# Patient Record
Sex: Female | Born: 1970 | Race: White | Hispanic: No | Marital: Married | Smoking: Never smoker
Health system: Southern US, Community
[De-identification: ages and names within clinical notes are randomized; demographics above are authoritative.]

## PROBLEM LIST (undated history)

## (undated) DIAGNOSIS — G8929 Other chronic pain: Secondary | ICD-10-CM

## (undated) DIAGNOSIS — J45909 Unspecified asthma, uncomplicated: Secondary | ICD-10-CM

## (undated) DIAGNOSIS — F909 Attention-deficit hyperactivity disorder, unspecified type: Secondary | ICD-10-CM

## (undated) DIAGNOSIS — M549 Dorsalgia, unspecified: Secondary | ICD-10-CM

## (undated) DIAGNOSIS — K317 Polyp of stomach and duodenum: Secondary | ICD-10-CM

## (undated) DIAGNOSIS — M1990 Unspecified osteoarthritis, unspecified site: Secondary | ICD-10-CM

## (undated) HISTORY — PX: KNEE SURGERY: SHX244

## (undated) HISTORY — DX: Dorsalgia, unspecified: M54.9

## (undated) HISTORY — PX: APPENDECTOMY: SHX54

## (undated) HISTORY — DX: Other chronic pain: G89.29

## (undated) HISTORY — PX: CHOLECYSTECTOMY: SHX55

## (undated) HISTORY — DX: Unspecified osteoarthritis, unspecified site: M19.90

## (undated) HISTORY — DX: Polyp of stomach and duodenum: K31.7

## (undated) HISTORY — PX: OVARIAN CYST REMOVAL: SHX89

---

## 1997-11-19 ENCOUNTER — Emergency Department (HOSPITAL_COMMUNITY): Admission: EM | Admit: 1997-11-19 | Discharge: 1997-11-19 | Payer: Self-pay | Admitting: Family Medicine

## 2005-01-27 ENCOUNTER — Inpatient Hospital Stay: Payer: Self-pay | Admitting: Internal Medicine

## 2005-01-27 ENCOUNTER — Ambulatory Visit: Payer: Self-pay | Admitting: Internal Medicine

## 2005-01-30 ENCOUNTER — Other Ambulatory Visit: Payer: Self-pay

## 2005-02-20 ENCOUNTER — Ambulatory Visit: Payer: Self-pay | Admitting: Internal Medicine

## 2005-05-01 ENCOUNTER — Ambulatory Visit: Payer: Self-pay | Admitting: Internal Medicine

## 2005-08-11 ENCOUNTER — Ambulatory Visit: Payer: Self-pay | Admitting: Surgery

## 2006-09-23 ENCOUNTER — Ambulatory Visit: Payer: Self-pay | Admitting: Internal Medicine

## 2006-10-14 ENCOUNTER — Ambulatory Visit: Payer: Self-pay | Admitting: Internal Medicine

## 2007-05-06 ENCOUNTER — Ambulatory Visit: Payer: Self-pay | Admitting: Gastroenterology

## 2007-06-02 ENCOUNTER — Ambulatory Visit: Payer: Self-pay | Admitting: Emergency Medicine

## 2007-06-03 ENCOUNTER — Ambulatory Visit: Payer: Self-pay | Admitting: Emergency Medicine

## 2010-12-10 ENCOUNTER — Other Ambulatory Visit: Payer: Self-pay | Admitting: Occupational Medicine

## 2010-12-10 ENCOUNTER — Ambulatory Visit: Payer: Self-pay

## 2010-12-10 DIAGNOSIS — R52 Pain, unspecified: Secondary | ICD-10-CM

## 2011-01-06 ENCOUNTER — Ambulatory Visit
Admission: RE | Admit: 2011-01-06 | Discharge: 2011-01-06 | Disposition: A | Discharge status: Home / Self Care | Payer: PRIVATE HEALTH INSURANCE | Source: Ambulatory Visit | Attending: Family Medicine | Admitting: Family Medicine

## 2011-01-06 ENCOUNTER — Other Ambulatory Visit: Payer: Self-pay | Admitting: Family Medicine

## 2011-01-06 DIAGNOSIS — M545 Low back pain: Secondary | ICD-10-CM

## 2011-01-08 ENCOUNTER — Ambulatory Visit: Payer: PRIVATE HEALTH INSURANCE

## 2011-01-13 ENCOUNTER — Ambulatory Visit: Payer: PRIVATE HEALTH INSURANCE | Attending: Family Medicine

## 2011-01-13 DIAGNOSIS — IMO0001 Reserved for inherently not codable concepts without codable children: Secondary | ICD-10-CM | POA: Insufficient documentation

## 2011-01-13 DIAGNOSIS — M25519 Pain in unspecified shoulder: Secondary | ICD-10-CM | POA: Insufficient documentation

## 2011-01-13 DIAGNOSIS — M545 Low back pain, unspecified: Secondary | ICD-10-CM | POA: Insufficient documentation

## 2011-01-13 DIAGNOSIS — R5381 Other malaise: Secondary | ICD-10-CM | POA: Insufficient documentation

## 2011-01-20 ENCOUNTER — Ambulatory Visit: Payer: PRIVATE HEALTH INSURANCE

## 2011-01-22 ENCOUNTER — Ambulatory Visit: Payer: PRIVATE HEALTH INSURANCE

## 2011-01-28 ENCOUNTER — Ambulatory Visit: Payer: PRIVATE HEALTH INSURANCE | Attending: Family Medicine

## 2011-01-28 DIAGNOSIS — M25519 Pain in unspecified shoulder: Secondary | ICD-10-CM | POA: Insufficient documentation

## 2011-01-28 DIAGNOSIS — R5381 Other malaise: Secondary | ICD-10-CM | POA: Insufficient documentation

## 2011-01-28 DIAGNOSIS — M545 Low back pain, unspecified: Secondary | ICD-10-CM | POA: Insufficient documentation

## 2011-01-28 DIAGNOSIS — IMO0001 Reserved for inherently not codable concepts without codable children: Secondary | ICD-10-CM | POA: Insufficient documentation

## 2011-01-29 ENCOUNTER — Ambulatory Visit: Payer: PRIVATE HEALTH INSURANCE

## 2011-02-03 ENCOUNTER — Ambulatory Visit: Payer: PRIVATE HEALTH INSURANCE

## 2011-02-05 ENCOUNTER — Other Ambulatory Visit (HOSPITAL_COMMUNITY): Payer: Self-pay | Admitting: Family Medicine

## 2011-02-05 DIAGNOSIS — M25511 Pain in right shoulder: Secondary | ICD-10-CM

## 2011-02-10 ENCOUNTER — Ambulatory Visit: Payer: PRIVATE HEALTH INSURANCE

## 2011-02-12 ENCOUNTER — Other Ambulatory Visit (HOSPITAL_COMMUNITY): Payer: Self-pay

## 2011-02-12 ENCOUNTER — Encounter: Payer: Self-pay | Admitting: Physical Therapy

## 2011-02-12 ENCOUNTER — Ambulatory Visit (HOSPITAL_COMMUNITY)
Admission: RE | Admit: 2011-02-12 | Discharge: 2011-02-12 | Disposition: A | Discharge status: Home / Self Care | Payer: PRIVATE HEALTH INSURANCE | Source: Ambulatory Visit | Attending: Family Medicine | Admitting: Family Medicine

## 2011-02-12 DIAGNOSIS — M25511 Pain in right shoulder: Secondary | ICD-10-CM

## 2011-02-12 DIAGNOSIS — IMO0001 Reserved for inherently not codable concepts without codable children: Secondary | ICD-10-CM | POA: Insufficient documentation

## 2011-02-12 DIAGNOSIS — X58XXXA Exposure to other specified factors, initial encounter: Secondary | ICD-10-CM | POA: Insufficient documentation

## 2011-02-12 DIAGNOSIS — M25519 Pain in unspecified shoulder: Secondary | ICD-10-CM | POA: Insufficient documentation

## 2011-02-25 ENCOUNTER — Ambulatory Visit: Payer: PRIVATE HEALTH INSURANCE | Admitting: Physical Therapy

## 2011-03-02 ENCOUNTER — Ambulatory Visit: Payer: PRIVATE HEALTH INSURANCE | Attending: Family Medicine | Admitting: Physical Therapy

## 2011-03-02 DIAGNOSIS — IMO0001 Reserved for inherently not codable concepts without codable children: Secondary | ICD-10-CM | POA: Insufficient documentation

## 2011-03-02 DIAGNOSIS — R5381 Other malaise: Secondary | ICD-10-CM | POA: Insufficient documentation

## 2011-03-02 DIAGNOSIS — M545 Low back pain, unspecified: Secondary | ICD-10-CM | POA: Insufficient documentation

## 2011-03-02 DIAGNOSIS — M25519 Pain in unspecified shoulder: Secondary | ICD-10-CM | POA: Insufficient documentation

## 2011-03-04 ENCOUNTER — Encounter: Payer: Self-pay | Admitting: Physical Therapy

## 2011-03-06 ENCOUNTER — Ambulatory Visit: Payer: PRIVATE HEALTH INSURANCE

## 2011-03-09 ENCOUNTER — Ambulatory Visit: Payer: PRIVATE HEALTH INSURANCE | Admitting: Physical Therapy

## 2011-03-16 ENCOUNTER — Ambulatory Visit: Payer: PRIVATE HEALTH INSURANCE | Admitting: Physical Therapy

## 2011-03-18 ENCOUNTER — Ambulatory Visit: Payer: PRIVATE HEALTH INSURANCE | Attending: Family Medicine | Admitting: Physical Therapy

## 2011-03-18 DIAGNOSIS — M25519 Pain in unspecified shoulder: Secondary | ICD-10-CM | POA: Insufficient documentation

## 2011-03-18 DIAGNOSIS — M545 Low back pain, unspecified: Secondary | ICD-10-CM | POA: Insufficient documentation

## 2011-03-18 DIAGNOSIS — R5381 Other malaise: Secondary | ICD-10-CM | POA: Insufficient documentation

## 2011-03-18 DIAGNOSIS — IMO0001 Reserved for inherently not codable concepts without codable children: Secondary | ICD-10-CM | POA: Insufficient documentation

## 2011-03-19 ENCOUNTER — Other Ambulatory Visit: Payer: Self-pay | Admitting: Specialist

## 2011-03-19 DIAGNOSIS — M549 Dorsalgia, unspecified: Secondary | ICD-10-CM

## 2011-03-23 ENCOUNTER — Encounter: Payer: Self-pay | Admitting: Physical Therapy

## 2011-03-24 ENCOUNTER — Ambulatory Visit
Admission: RE | Admit: 2011-03-24 | Discharge: 2011-03-24 | Disposition: A | Discharge status: Home / Self Care | Payer: PRIVATE HEALTH INSURANCE | Source: Ambulatory Visit | Attending: Specialist | Admitting: Specialist

## 2011-03-24 DIAGNOSIS — M549 Dorsalgia, unspecified: Secondary | ICD-10-CM

## 2011-03-25 ENCOUNTER — Encounter: Payer: Self-pay | Admitting: Physical Therapy

## 2012-02-04 LAB — HM PAP SMEAR: HM PAP: NEGATIVE

## 2012-02-18 ENCOUNTER — Other Ambulatory Visit: Payer: Self-pay | Admitting: Internal Medicine

## 2012-02-23 LAB — CULTURE, BLOOD (SINGLE)

## 2012-03-26 ENCOUNTER — Ambulatory Visit: Payer: Self-pay | Admitting: Internal Medicine

## 2012-06-01 ENCOUNTER — Emergency Department (HOSPITAL_COMMUNITY)
Admission: EM | Admit: 2012-06-01 | Discharge: 2012-06-01 | Disposition: A | Discharge status: Home / Self Care | Payer: PRIVATE HEALTH INSURANCE | Attending: Emergency Medicine | Admitting: Emergency Medicine

## 2012-06-01 ENCOUNTER — Encounter (HOSPITAL_COMMUNITY): Payer: Self-pay | Admitting: Emergency Medicine

## 2012-06-01 DIAGNOSIS — F909 Attention-deficit hyperactivity disorder, unspecified type: Secondary | ICD-10-CM | POA: Insufficient documentation

## 2012-06-01 DIAGNOSIS — M549 Dorsalgia, unspecified: Secondary | ICD-10-CM | POA: Insufficient documentation

## 2012-06-01 DIAGNOSIS — J45909 Unspecified asthma, uncomplicated: Secondary | ICD-10-CM | POA: Insufficient documentation

## 2012-06-01 DIAGNOSIS — Z79899 Other long term (current) drug therapy: Secondary | ICD-10-CM | POA: Insufficient documentation

## 2012-06-01 HISTORY — DX: Attention-deficit hyperactivity disorder, unspecified type: F90.9

## 2012-06-01 HISTORY — DX: Dorsalgia, unspecified: M54.9

## 2012-06-01 HISTORY — DX: Unspecified asthma, uncomplicated: J45.909

## 2012-06-01 MED ORDER — DIPHENHYDRAMINE HCL 25 MG PO CAPS
25.0000 mg | ORAL_CAPSULE | Freq: Once | ORAL | Status: AC
  Administered 2012-06-01: 25 mg via ORAL
  Filled 2012-06-01: qty 1

## 2012-06-01 MED ORDER — HYDROMORPHONE HCL PF 1 MG/ML IJ SOLN
1.0000 mg | Freq: Once | INTRAMUSCULAR | Status: AC
  Administered 2012-06-01: 1 mg via INTRAMUSCULAR
  Filled 2012-06-01: qty 1

## 2012-06-01 MED ORDER — KETOROLAC TROMETHAMINE 60 MG/2ML IM SOLN
60.0000 mg | Freq: Once | INTRAMUSCULAR | Status: AC
  Administered 2012-06-01: 60 mg via INTRAMUSCULAR
  Filled 2012-06-01: qty 2

## 2012-06-01 NOTE — ED Notes (Signed)
Pt is here for ongoing back pain. Hx of back pain.

## 2012-06-01 NOTE — ED Provider Notes (Signed)
History     CSN: [id number]  Arrival date & time 06/01/12  1757   First MD Initiated Contact with Patient 06/01/12 1819      Chief Complaint  Patient presents with  . Back Pain    (Consider location/radiation/quality/duration/timing/severity/associated sxs/prior treatment) Patient is a 42 y.o. female presenting with back pain. The history is provided by the patient.  Back Pain Location:  Lumbar spine Quality:  Aching Radiates to:  R thigh and L thigh Pain severity:  Severe Pain is:  Same all the time Onset quality:  Gradual Timing:  Constant Progression:  Worsening Chronicity:  Recurrent Relieved by:  Nothing Worsened by:  Standing Ineffective treatments:  Muscle relaxants Associated symptoms: no bladder incontinence, no dysuria, no fever and no perianal numbness     Past Medical History  Diagnosis Date  . Back pain   . Asthma   . ADHD (attention deficit hyperactivity disorder)     Past Surgical History  Procedure Laterality Date  . Knee surgery    . Appendectomy    . Cholecystectomy    . Ovarian cyst removal      No family history on file.  History  Substance Use Topics  . Smoking status: Not on file  . Smokeless tobacco: Not on file  . Alcohol Use: Not on file    OB History   Grav Para Term Preterm Abortions TAB SAB Ect Mult Living                  Review of Systems  Constitutional: Negative for fever.  Genitourinary: Negative for bladder incontinence and dysuria.  Musculoskeletal: Positive for back pain.  All other systems reviewed and are negative.    Allergies  Stadol; Demerol; Imitrex; Influenza vaccines; Latex; Lidocaine; Morphine and related; Phenergan; Procardia; Terazol; and Tetanus toxoids  Home Medications   Current Outpatient Rx  Name  Route  Sig  Dispense  Refill  . amphetamine-dextroamphetamine (ADDERALL) 30 MG tablet   Oral   Take 30 mg by mouth 2 (two) times daily.         Marland Kitchen azelastine (ASTELIN) 137 MCG/SPRAY nasal  spray   Nasal   Place 1 spray into the nose 2 (two) times daily. Use in each nostril as directed         . cetirizine (ZYRTEC) 10 MG tablet   Oral   Take 10 mg by mouth daily.         . diazepam (VALIUM) 5 MG tablet   Oral   Take 5 mg by mouth 3 (three) times daily.         Marland Kitchen dicyclomine (BENTYL) 20 MG tablet   Oral   Take 20 mg by mouth every 6 (six) hours. Irritable bowel         . levalbuterol (XOPENEX HFA) 45 MCG/ACT inhaler   Inhalation   Inhale 1-2 puffs into the lungs every 4 (four) hours as needed for wheezing.         . ondansetron (ZOFRAN) 4 MG tablet   Oral   Take 4 mg by mouth every 8 (eight) hours as needed for nausea.         . sucralfate (CARAFATE) 1 G tablet   Oral   Take 1 g by mouth 3 (three) times daily.         . traMADol (ULTRAM) 50 MG tablet   Oral   Take 50-100 mg by mouth every 8 (eight) hours as needed for pain.  BP 147/91  Pulse 129  Temp(Src) 98.6 F (37 C) (Oral)  Resp 17  SpO2 99%  Physical Exam  Nursing note and vitals reviewed. Constitutional: She is oriented to person, place, and time. She appears well-developed and well-nourished.  Non-toxic appearance. No distress.  HENT:  Head: Normocephalic and atraumatic.  Eyes: Conjunctivae, EOM and lids are normal. Pupils are equal, round, and reactive to light.  Neck: Normal range of motion. Neck supple. No tracheal deviation present. No mass present.  Cardiovascular: Regular rhythm and normal heart sounds.  Tachycardia present.  Exam reveals no gallop.   No murmur heard. Pulmonary/Chest: Effort normal and breath sounds normal. No stridor. No respiratory distress. She has no decreased breath sounds. She has no wheezes. She has no rhonchi. She has no rales.  Abdominal: Soft. Normal appearance and bowel sounds are normal. She exhibits no distension. There is no tenderness. There is no rebound and no CVA tenderness.  Musculoskeletal: Normal range of motion. She exhibits  no edema and no tenderness.       Back:  Neurological: She is alert and oriented to person, place, and time. She has normal strength. No cranial nerve deficit or sensory deficit. GCS eye subscore is 4. GCS verbal subscore is 5. GCS motor subscore is 6.  Skin: Skin is warm and dry. No abrasion and no rash noted.  Psychiatric: She has a normal mood and affect. Her speech is normal and behavior is normal.    ED Course  Procedures (including critical care time)  Labs Reviewed - No data to display No results found.   No diagnosis found.    MDM  Patient given Toradol and opiates. Will followup with her Dr. Sharen Hones week       Toy Baker, MD 06/01/12 1950

## 2013-06-25 ENCOUNTER — Emergency Department: Payer: Self-pay | Admitting: Emergency Medicine

## 2014-09-20 ENCOUNTER — Ambulatory Visit: Payer: Medicaid Other | Admitting: Obstetrics and Gynecology

## 2014-10-09 ENCOUNTER — Encounter: Payer: Self-pay | Admitting: *Deleted

## 2014-10-11 ENCOUNTER — Encounter: Payer: Medicaid Other | Admitting: Obstetrics and Gynecology

## 2014-11-21 ENCOUNTER — Encounter: Payer: Medicaid Other | Admitting: Obstetrics and Gynecology

## 2014-12-26 ENCOUNTER — Other Ambulatory Visit: Payer: Self-pay | Admitting: Internal Medicine

## 2014-12-26 DIAGNOSIS — M5136 Other intervertebral disc degeneration, lumbar region: Secondary | ICD-10-CM

## 2015-01-03 ENCOUNTER — Encounter: Payer: Medicaid Other | Admitting: Obstetrics and Gynecology

## 2015-01-09 ENCOUNTER — Ambulatory Visit: Payer: PRIVATE HEALTH INSURANCE

## 2015-01-25 ENCOUNTER — Ambulatory Visit: Payer: Medicaid Other

## 2015-02-14 ENCOUNTER — Ambulatory Visit: Payer: Medicaid Other

## 2015-02-21 ENCOUNTER — Encounter: Payer: Self-pay | Admitting: Obstetrics and Gynecology

## 2015-02-21 ENCOUNTER — Ambulatory Visit (INDEPENDENT_AMBULATORY_CARE_PROVIDER_SITE_OTHER): Payer: Medicaid Other | Admitting: Obstetrics and Gynecology

## 2015-02-21 ENCOUNTER — Telehealth: Payer: Self-pay | Admitting: Obstetrics and Gynecology

## 2015-02-21 VITALS — BP 102/68 | HR 110 | Ht 65.0 in | Wt 136.0 lb

## 2015-02-21 DIAGNOSIS — Z8639 Personal history of other endocrine, nutritional and metabolic disease: Secondary | ICD-10-CM | POA: Diagnosis not present

## 2015-02-21 DIAGNOSIS — T402X5A Adverse effect of other opioids, initial encounter: Secondary | ICD-10-CM | POA: Diagnosis not present

## 2015-02-21 DIAGNOSIS — K5903 Drug induced constipation: Secondary | ICD-10-CM

## 2015-02-21 DIAGNOSIS — M5136 Other intervertebral disc degeneration, lumbar region: Secondary | ICD-10-CM | POA: Insufficient documentation

## 2015-02-21 DIAGNOSIS — Z0000 Encounter for general adult medical examination without abnormal findings: Secondary | ICD-10-CM | POA: Diagnosis not present

## 2015-02-21 DIAGNOSIS — Z01419 Encounter for gynecological examination (general) (routine) without abnormal findings: Secondary | ICD-10-CM

## 2015-02-21 MED ORDER — NORETHIN-ETH ESTRAD-FE BIPHAS 1 MG-10 MCG / 10 MCG PO TABS: 1.0000 | ORAL_TABLET | Freq: Every day | ORAL | Status: DC

## 2015-02-21 MED ORDER — LINACLOTIDE 290 MCG PO CAPS: 290.0000 ug | ORAL_CAPSULE | Freq: Every day | ORAL | Status: DC

## 2015-02-21 NOTE — Patient Instructions (Signed)
  Place annual gynecologic exam patient instructions here.  Thank you for enrolling in MyChart. Please follow the instructions below to securely access your online medical record. MyChart allows you to send messages to your doctor, view your test results, manage appointments, and more.   How Do I Sign Up? 1. In your Internet browser, go to [employer] Address [employer] and enter [email or web address]. 2. Click on the Sign Up Now link in the Sign In box. You will see the New Member Sign Up page. 3. Enter your MyChart Access Code exactly as it appears below. You will not need to use this code after you've completed the sign-up process. If you do not sign up before the expiration date, you must request a new code.  MyChart Access Code: [email or web address] Expires: 03/24/2015 10:34 AM  4. Enter your Social Security Number ([id number]) and Date of Birth (mm/dd/yyyy) as indicated and click Submit. You will be taken to the next sign-up page. 5. Create a MyChart ID. This will be your MyChart login ID and cannot be changed, so think of one that is secure and easy to remember. 6. Create a MyChart password. You can change your password at any time. 7. Enter your Password Reset Question and Answer. This can be used at a later time if you forget your password.  8. Enter your e-mail address. You will receive e-mail notification when new information is available in MyChart. 9. Click Sign Up. You can now view your medical record.   Additional Information Remember, MyChart is NOT to be used for urgent needs. For medical emergencies, dial 911.

## 2015-02-21 NOTE — Progress Notes (Signed)
Subjective:   Emily Lynn is a 44 y.o. No obstetric history on file. Caucasian female here for a routine well-woman exam.  No LMP recorded. Patient is not currently having periods (Reason: Oral contraceptives).    Current complaints: back pain and spasms PCP: Klien      Doesn't need  Labs- PCP does  Social History: Sexual: heterosexual Marital Status: married Living situation: with family Occupation: unemployed Tobacco/alcohol: no tobacco use Illicit drugs: no history of illicit drug use  The following portions of the patient's history were reviewed and updated as appropriate: allergies, current medications, past family history, past medical history, past social history, past surgical history and problem list.  Past Medical History Past Medical History  Diagnosis Date  . Back pain   . Asthma   . ADHD (attention deficit hyperactivity disorder)   . Gastric polyp   . Arthritis   . Chronic back pain   . ADHD (attention deficit hyperactivity disorder)     Past Surgical History Past Surgical History  Procedure Laterality Date  . Knee surgery    . Appendectomy    . Cholecystectomy    . Ovarian cyst removal      Gynecologic History No obstetric history on file.  No LMP recorded. Patient is not currently having periods (Reason: Oral contraceptives). Contraception: coitus interruptus Last Pap: 2013. Results were: normal Last mammogram: at age 44. Results were: normal  Obstetric History OB History  No data available    Current Medications Current Outpatient Prescriptions on File Prior to Visit  Medication Sig Dispense Refill  . amphetamine-dextroamphetamine (ADDERALL) 30 MG tablet Take 30 mg by mouth 2 (two) times daily.    Marland Kitchen. azelastine (ASTELIN) 137 MCG/SPRAY nasal spray Place 1 spray into the nose 2 (two) times daily. Use in each nostril as directed    . cetirizine (ZYRTEC) 10 MG tablet Take 10 mg by mouth daily.    . diazepam (VALIUM) 5 MG tablet Take 5 mg by mouth  2 (two) times daily.     Marland Kitchen. dicyclomine (BENTYL) 20 MG tablet Take 20 mg by mouth every 6 (six) hours. Irritable bowel    . levalbuterol (XOPENEX HFA) 45 MCG/ACT inhaler Inhale 1-2 puffs into the lungs every 4 (four) hours as needed for wheezing.    . ondansetron (ZOFRAN) 4 MG tablet Take 4 mg by mouth every 8 (eight) hours as needed for nausea.    . sucralfate (CARAFATE) 1 G tablet Take 1 g by mouth 3 (three) times daily.    . traMADol (ULTRAM) 50 MG tablet Take 50-100 mg by mouth every 8 (eight) hours as needed for pain.     No current facility-administered medications on file prior to visit.    Review of Systems Patient denies any headaches, blurred vision, shortness of breath, chest pain, abdominal pain, problems with bowel movements, urination, or intercourse.  Objective:  BP 102/68 mmHg  Pulse 110  Ht 5\' 5"  (1.651 m)  Wt 136 lb (61.689 kg)  BMI 22.63 kg/m2 Physical Exam  General:  Well developed, well nourished, no acute distress. She is alert and oriented x3. Skin:  Warm and dry Neck:  Midline trachea, no thyromegaly or nodules Cardiovascular: Increased but Regular rate and rhythm, no murmur heard Lungs:  Effort normal, all lung fields clear to auscultation bilaterally Breasts:  No dominant palpable mass, retraction, or nipple discharge, fibrocystic bilaterally Abdomen:  Soft, non tender, no hepatosplenomegaly or masses Pelvic:  External genitalia is normal in appearance.  The vagina  is normal in appearance. The cervix is bulbous, no CMT.  Thin prep pap is done with HR HPV cotesting. Uterus is felt to be normal size, shape, and contour.  No adnexal masses or tenderness noted. Extremities:  No swelling or varicosities noted Psych:  She has a normal mood and affect, with flight of thought, easily aggitated  Assessment:   Healthy well-woman exam OCP user Constipation H/o vit D deficiency Chronic back pain  Plan:  Pap and labs obtianed F/U 1 year for AE, or sooner if  needed Mammogram scheduled Linzess and LoLoEstrin prescribed Emilliano Dilworth Suzan Nailer, CNM

## 2015-02-21 NOTE — Telephone Encounter (Signed)
PT CALLED AND SHE WANTED ME TO LET YOU KNOW THAT HER FERTERNAL UNCLE HAD PROSTATE CANCER AND ANOTHER BROTHER OTHER THEN THE ONE THAT WAS WRITTEN DOWN HAD MELANOMA.

## 2015-02-22 ENCOUNTER — Other Ambulatory Visit: Payer: Self-pay | Admitting: Obstetrics and Gynecology

## 2015-02-22 DIAGNOSIS — E559 Vitamin D deficiency, unspecified: Secondary | ICD-10-CM

## 2015-02-22 LAB — VITAMIN D 25 HYDROXY (VIT D DEFICIENCY, FRACTURES): Vit D, 25-Hydroxy: 17.5 ng/mL — ABNORMAL LOW (ref 30.0–100.0)

## 2015-02-22 MED ORDER — VITAMIN D (ERGOCALCIFEROL) 1.25 MG (50000 UNIT) PO CAPS: 50000.0000 [IU] | ORAL_CAPSULE | ORAL | Status: DC

## 2015-02-26 LAB — CYTOLOGY - PAP

## 2015-03-11 ENCOUNTER — Ambulatory Visit
Admission: RE | Admit: 2015-03-11 | Discharge: 2015-03-11 | Disposition: A | Discharge status: Home / Self Care | Payer: Medicaid Other | Source: Ambulatory Visit | Attending: Internal Medicine | Admitting: Internal Medicine

## 2015-03-11 DIAGNOSIS — M5136 Other intervertebral disc degeneration, lumbar region: Secondary | ICD-10-CM

## 2015-03-11 DIAGNOSIS — M479 Spondylosis, unspecified: Secondary | ICD-10-CM | POA: Diagnosis not present

## 2015-03-11 NOTE — Telephone Encounter (Signed)
Mailed info to pt about her Vit d

## 2015-03-11 NOTE — Telephone Encounter (Signed)
-----   Message from KincheloeMelody N Shambley, [place] sent at 02/22/2015  8:29 AM EST ----- Please let her know vit D is still low, needs to be between 30-100, ideally above 50. I sent in a rx for 2 x weekly supplement, and she needs to have it rechecked in 3 months, when it gets above 30 she will need to remain on weekly dosing.  Please mail info on vit D def.

## 2015-05-02 ENCOUNTER — Telehealth: Payer: Self-pay | Admitting: Obstetrics and Gynecology

## 2015-05-02 NOTE — Telephone Encounter (Signed)
She wants hydrocoritisone suppositories and Linzess lower dose.  [facility] .

## 2015-05-02 NOTE — Telephone Encounter (Signed)
pls advise

## 2015-05-02 NOTE — Telephone Encounter (Signed)
Ok to refill both and send there, x 3 months refills

## 2015-05-06 ENCOUNTER — Other Ambulatory Visit: Payer: Self-pay | Admitting: *Deleted

## 2015-05-06 MED ORDER — LINACLOTIDE 145 MCG PO CAPS: 145.0000 ug | ORAL_CAPSULE | Freq: Every day | ORAL | Status: DC

## 2015-05-30 ENCOUNTER — Ambulatory Visit: Payer: Medicaid Other | Admitting: Obstetrics and Gynecology

## 2015-06-20 ENCOUNTER — Ambulatory Visit: Payer: Medicaid Other | Admitting: Obstetrics and Gynecology

## 2015-07-11 ENCOUNTER — Telehealth: Payer: Self-pay | Admitting: Obstetrics and Gynecology

## 2015-07-11 NOTE — Telephone Encounter (Signed)
ptcalled and said she is having break thru bleeding and doesn't want to take LOestren FE anymore and wants a Monophasic ???  BC to take it's place/  SHE CANCELLED HER LAB TOMORROW AND SAID SHE WOULD DO IT SOON/ REMIND YOU THAT HER POT WAS LOW LAST TIME 3.2

## 2015-07-11 NOTE — Telephone Encounter (Signed)
pls advise

## 2015-07-11 NOTE — Telephone Encounter (Signed)
Please ask her what pill she prefers- as Lo estrin is 20mg , and next dose up is 30mg  and a few different options. Also she needs to go ahead and get labs drawn.

## 2015-07-12 ENCOUNTER — Other Ambulatory Visit: Payer: Self-pay | Admitting: *Deleted

## 2015-07-12 ENCOUNTER — Ambulatory Visit: Payer: Medicaid Other | Admitting: Obstetrics and Gynecology

## 2015-07-12 ENCOUNTER — Other Ambulatory Visit: Payer: Medicaid Other

## 2015-07-12 MED ORDER — DROSPIRENONE-ETHINYL ESTRADIOL 3-0.02 MG PO TABS: 1.0000 | ORAL_TABLET | Freq: Every day | ORAL | Status: DC

## 2015-07-12 NOTE — Telephone Encounter (Signed)
Sent in yaz for pt

## 2015-12-25 ENCOUNTER — Ambulatory Visit (INDEPENDENT_AMBULATORY_CARE_PROVIDER_SITE_OTHER): Payer: Medicaid Other | Admitting: Obstetrics and Gynecology

## 2015-12-25 ENCOUNTER — Encounter: Payer: Self-pay | Admitting: Obstetrics and Gynecology

## 2015-12-25 VITALS — BP 122/80 | HR 100 | Ht 65.0 in | Wt 134.2 lb

## 2015-12-25 DIAGNOSIS — N921 Excessive and frequent menstruation with irregular cycle: Secondary | ICD-10-CM | POA: Diagnosis not present

## 2015-12-25 DIAGNOSIS — E559 Vitamin D deficiency, unspecified: Secondary | ICD-10-CM | POA: Diagnosis not present

## 2015-12-25 DIAGNOSIS — G8929 Other chronic pain: Secondary | ICD-10-CM

## 2015-12-25 DIAGNOSIS — N938 Other specified abnormal uterine and vaginal bleeding: Secondary | ICD-10-CM

## 2015-12-25 DIAGNOSIS — N946 Dysmenorrhea, unspecified: Secondary | ICD-10-CM

## 2015-12-25 DIAGNOSIS — R000 Tachycardia, unspecified: Secondary | ICD-10-CM | POA: Diagnosis not present

## 2015-12-25 MED ORDER — TRAMADOL HCL 50 MG PO TABS: 100.0000 mg | ORAL_TABLET | Freq: Four times a day (QID) | ORAL | 0 refills | Status: AC | PRN

## 2015-12-25 MED ORDER — YASMIN 28 3-0.03 MG PO TABS: 1.0000 | ORAL_TABLET | Freq: Every day | ORAL | 4 refills | Status: DC

## 2015-12-25 MED ORDER — DICLOFENAC SODIUM 1 % TD GEL: 2.0000 g | Freq: Four times a day (QID) | TRANSDERMAL | 2 refills | Status: AC

## 2015-12-25 NOTE — Progress Notes (Signed)
``  Subjective:     Patient ID: Emily Lynn, female   DOB: January 02, 1971, 45 y.o.   MRN: [id number]  HPI Reports breakthrough bleeding for last 2 months with heavy menses and clots, still taking same OCP (generic Beyaz). States cramping severe with bleeding. Feels like it is related to reduction in pain medications by PCP as timing is related, although she has done this before on other OCPs.  Reports increased need for tramadol and voltaren gel with bleeding and cramping and PCP refuses to give it to her.  Also concerned about elevated HR with activity and pain med use. This is not new but seems more of an issue.   Review of Systems Negative except stated  in HPI    Objective:   Physical Exam A&O x4 Well groomed female in no distress Blood pressure 122/80, pulse 100, height 5\' 5"  (1.651 m), weight 134 lb 3.2 oz (60.9 kg). Abdomen soft and non-tender Pelvic exam: normal external genitalia, vulva, vagina, cervix, uterus and adnexa, bladder tender to palpation.     Assessment:     BTB on OCP DUB Dysmenorrhea Chronic pain Intermittent tachycardia Vitamin d deficiency     Plan:     Refilled tramadol and volteran gel, changed OCPs to Yasmin (no generic), labs drawn per patient request.  Explained to patient I could not refill her other long term pain meds, and recommend she return to PCP or Neurosurgeon for management of those issues.   Also discussed trial of Nuvaring if this OCP doesn't work, but not fond of idea.  RTC as needed.  >50% of 25 minute visit spent in counseling

## 2015-12-28 LAB — CBC
Hematocrit: 40.2 % (ref 34.0–46.6)
Hemoglobin: 14.1 g/dL (ref 11.1–15.9)
MCH: 29.4 pg (ref 26.6–33.0)
MCHC: 35.1 g/dL (ref 31.5–35.7)
MCV: 84 fL (ref 79–97)
PLATELETS: 411 10*3/uL — AB (ref 150–379)
RBC: 4.79 x10E6/uL (ref 3.77–5.28)
RDW: 12 % — ABNORMAL LOW (ref 12.3–15.4)
WBC: 7.1 10*3/uL (ref 3.4–10.8)

## 2015-12-28 LAB — THYROID PANEL WITH TSH
FREE THYROXINE INDEX: 2.1 (ref 1.2–4.9)
T3 Uptake Ratio: 18 % — ABNORMAL LOW (ref 24–39)
T4 TOTAL: 11.9 ug/dL (ref 4.5–12.0)
TSH: 1.6 u[IU]/mL (ref 0.450–4.500)

## 2015-12-28 LAB — IRON: Iron: 88 ug/dL (ref 27–159)

## 2015-12-28 LAB — VITAMIN D 25 HYDROXY (VIT D DEFICIENCY, FRACTURES): Vit D, 25-Hydroxy: 22.3 ng/mL — ABNORMAL LOW (ref 30.0–100.0)

## 2015-12-28 LAB — MAGNESIUM: Magnesium: 1.9 mg/dL (ref 1.6–2.3)

## 2015-12-31 ENCOUNTER — Telehealth: Payer: Self-pay | Admitting: *Deleted

## 2015-12-31 NOTE — Telephone Encounter (Signed)
notifieid p

## 2015-12-31 NOTE — Telephone Encounter (Signed)
-----   Message from Purcell NailsMelody N Shambley, [place] sent at 12/30/2015  6:36 PM EST ----- Please print a copy of labs and mail to her. And remind her to sign of for MyChart.

## 2016-01-01 ENCOUNTER — Telehealth: Payer: Self-pay | Admitting: Obstetrics and Gynecology

## 2016-01-01 NOTE — Telephone Encounter (Signed)
Pt called and her phone kept breaking up but what I could here was something about results and what she needs to do. She would like a call back.

## 2016-01-10 NOTE — Telephone Encounter (Signed)
Mailed pt all her lab results

## 2016-02-26 ENCOUNTER — Telehealth: Payer: Self-pay | Admitting: Obstetrics and Gynecology

## 2016-02-26 ENCOUNTER — Encounter: Payer: Medicaid Other | Admitting: Obstetrics and Gynecology

## 2016-02-26 NOTE — Telephone Encounter (Signed)
Emily Lynn CALLED AND SAID HER TRAMADOL IS OUT AND SHE IS BASICALLY DOING NOTHING. ALSO SHE WANTS HER LABS RESULTS

## 2016-02-27 NOTE — Telephone Encounter (Signed)
Done-ac 

## 2016-04-30 ENCOUNTER — Ambulatory Visit (INDEPENDENT_AMBULATORY_CARE_PROVIDER_SITE_OTHER): Payer: Medicaid Other | Admitting: Obstetrics and Gynecology

## 2016-04-30 ENCOUNTER — Encounter: Payer: Self-pay | Admitting: Obstetrics and Gynecology

## 2016-04-30 VITALS — BP 113/81 | HR 106 | Ht 65.0 in | Wt 140.5 lb

## 2016-04-30 DIAGNOSIS — Z0000 Encounter for general adult medical examination without abnormal findings: Secondary | ICD-10-CM | POA: Diagnosis not present

## 2016-04-30 DIAGNOSIS — Z01411 Encounter for gynecological examination (general) (routine) with abnormal findings: Secondary | ICD-10-CM

## 2016-04-30 MED ORDER — PHENAZOPYRIDINE HCL 200 MG PO TABS: 200.0000 mg | ORAL_TABLET | Freq: Three times a day (TID) | ORAL | 1 refills | Status: AC | PRN

## 2016-04-30 MED ORDER — LEVOTHYROXINE SODIUM 50 MCG PO TABS: 50.0000 ug | ORAL_TABLET | Freq: Every day | ORAL | 6 refills | Status: DC

## 2016-04-30 MED ORDER — ONDANSETRON HCL 8 MG PO TABS: 8.0000 mg | ORAL_TABLET | Freq: Four times a day (QID) | ORAL | 5 refills | Status: DC | PRN

## 2016-04-30 NOTE — Progress Notes (Signed)
Subjective:   Emily Lynn is a 46 y.o. G8P0 Caucasian female here for a routine well-woman exam.  No LMP recorded. Patient is not currently having periods (Reason: Oral contraceptives).    Current complaints: feeling better since restarting synthroid 0.5mg  (spouse old RX)  For last 1-2 months., and BTB is less. Energy is better.  PCP: Graciela Husbands       does desire labs  Social History: Sexual: heterosexual Marital Status: married Living situation: with family Occupation: unemployed Tobacco/alcohol: no tobacco use Illicit drugs: no history of illicit drug use  The following portions of the patient's history were reviewed and updated as appropriate: allergies, current medications, past family history, past medical history, past social history, past surgical history and problem list.  Past Medical History Past Medical History:  Diagnosis Date  . ADHD (attention deficit hyperactivity disorder)   . ADHD (attention deficit hyperactivity disorder)   . Arthritis   . Asthma   . Back pain   . Chronic back pain   . Gastric polyp     Past Surgical History Past Surgical History:  Procedure Laterality Date  . APPENDECTOMY    . CHOLECYSTECTOMY    . KNEE SURGERY    . OVARIAN CYST REMOVAL      Gynecologic History G2P0  No LMP recorded. Patient is not currently having periods (Reason: Oral contraceptives). Contraception: oral progesterone-only contraceptive Last Pap: 2016. Results were: normal Last mammogram: ?. Results were: normal   Obstetric History OB History  Gravida Para Term Preterm AB Living  2            SAB TAB Ectopic Multiple Live Births               # Outcome Date GA Lbr Len/2nd Weight Sex Delivery Anes PTL Lv  2 Gravida 1993     Vag-Spont     1 Gravida 1992     Vag-Spont         Current Medications Current Outpatient Prescriptions on File Prior to Visit  Medication Sig Dispense Refill  . amphetamine-dextroamphetamine (ADDERALL) 30 MG tablet Take 30 mg by mouth  2 (two) times daily.    . cetirizine (ZYRTEC) 10 MG tablet Take 10 mg by mouth daily.    . diazepam (VALIUM) 5 MG tablet Take 5 mg by mouth 2 (two) times daily.     . diclofenac sodium (VOLTAREN) 1 % GEL Apply 2 g topically 4 (four) times daily. 1 Tube 2  . dicyclomine (BENTYL) 20 MG tablet Take 20 mg by mouth every 6 (six) hours. Irritable bowel    . levalbuterol (XOPENEX HFA) 45 MCG/ACT inhaler Inhale 1-2 puffs into the lungs every 4 (four) hours as needed for wheezing.    Marland Kitchen morphine (MSIR) 30 MG tablet Take 30 mg by mouth every 4 (four) hours as needed for severe pain.    Marland Kitchen ondansetron (ZOFRAN) 4 MG tablet Take 4 mg by mouth every 8 (eight) hours as needed for nausea.    . sucralfate (CARAFATE) 1 G tablet Take 1 g by mouth 3 (three) times daily.    . traMADol (ULTRAM) 50 MG tablet Take 2 tablets (100 mg total) by mouth every 6 (six) hours as needed. 90 tablet 0  . Vitamin D, Ergocalciferol, (DRISDOL) 50000 units CAPS capsule Take 1 capsule (50,000 Units total) by mouth 2 (two) times a week. 30 capsule 2  . YASMIN 28 3-0.03 MG tablet Take 1 tablet by mouth daily. 3 Package 4   No current  facility-administered medications on file prior to visit.     Review of Systems Patient denies any headaches, blurred vision, shortness of breath, chest pain, abdominal pain, problems with bowel movements, urination, or intercourse.  Objective:  BP 113/81   Pulse (!) 106   Ht 5\' 5"  (1.651 m)   Wt 140 lb 8 oz (63.7 kg)   BMI 23.38 kg/m  Physical Exam  General:  Well developed, well nourished, no acute distress. She is alert and oriented x3. Skin:  Warm and dry Neck:  Midline trachea, no thyromegaly or nodules Cardiovascular: Regular rate and rhythm, no murmur heard Lungs:  Effort normal, all lung fields clear to auscultation bilaterally Breasts:  No dominant palpable mass, retraction, or nipple discharge Abdomen:  Soft, non tender, no hepatosplenomegaly or masses Pelvic:  External genitalia is  normal in appearance.  The vagina is normal in appearance. The cervix is bulbous, no CMT.  Thin prep pap is not done . Uterus is felt to be normal size, shape, and contour.  No adnexal masses or tenderness noted. Extremities:  No swelling or varicosities noted Psych:  She has a normal mood and affect  Assessment:   Healthy well-woman exam Chronic pain Recurrent UTI Vitamin d def.  Plan:  Labs obtained. F/U 1 year for AE, or sooner if needed Mammogram ordered  Morrell Fluke Suzan NailerN Jacobey Gura, CNM

## 2016-04-30 NOTE — Patient Instructions (Signed)
Thank you for enrolling in MyChart. Please follow the instructions below to securely access your online medical record. MyChart allows you to send messages to your doctor, view your test results, renew your prescriptions, schedule appointments, and more.  How Do I Sign Up? 1. In your Internet browser, go to [email or web address]. 2. Click on the New  User? link in the Sign In box.  3. Enter your MyChart Access Code exactly as it appears below. You will not need to use this code after you have completed the sign-up process. If you do not sign up before the expiration date, you must request a new code. MyChart Access Code: [email or web address] Expires: 06/29/2016  4:33 PM  4. Enter the last four digits of your Social Security Number (xxxx) and Date of Birth (mm/dd/yyyy) as indicated and click Next. You will be taken to the next sign-up page. 5. Create a MyChart ID. This will be your MyChart login ID and cannot be changed, so think of one that is secure and easy to remember. 6. Create a MyChart password. You can change your password at any time. 7. Enter your Password Reset Question and Answer and click Next. This can be used at a later time if you forget your password.  8. Select your communication preference, and if applicable enter your e-mail address. You will receive e-mail notification when new information is available in MyChart by choosing to receive e-mail notifications and filling in your e-mail. 9. Click Sign In. You can now view your medical record.   Additional Information If you have questions, you can email [email or web address] or call [phone] to talk to our MyChart staff. Remember, MyChart is NOT to be used for urgent needs. For medical emergencies, dial 911.

## 2016-05-01 ENCOUNTER — Telehealth: Payer: Self-pay | Admitting: Obstetrics and Gynecology

## 2016-05-01 ENCOUNTER — Other Ambulatory Visit: Payer: Self-pay | Admitting: *Deleted

## 2016-05-01 MED ORDER — SYNTHROID 50 MCG PO TABS
50.0000 ug | ORAL_TABLET | Freq: Every day | ORAL | 6 refills | Status: AC
Start: 2016-05-01 — End: ?

## 2016-05-01 NOTE — Telephone Encounter (Signed)
Done-ac 

## 2016-05-01 NOTE — Telephone Encounter (Signed)
[facility] stating the patient said she wants a name brand Levothyroxine and they need a new rx sent over showing the same thing. Please advise.

## 2016-05-02 LAB — CBC
HEMOGLOBIN: 13.9 g/dL (ref 11.1–15.9)
Hematocrit: 41.1 % (ref 34.0–46.6)
MCH: 29.9 pg (ref 26.6–33.0)
MCHC: 33.8 g/dL (ref 31.5–35.7)
MCV: 88 fL (ref 79–97)
Platelets: 387 10*3/uL — ABNORMAL HIGH (ref 150–379)
RBC: 4.65 x10E6/uL (ref 3.77–5.28)
RDW: 12.9 % (ref 12.3–15.4)
WBC: 9.2 10*3/uL (ref 3.4–10.8)

## 2016-05-02 LAB — COMPREHENSIVE METABOLIC PANEL
A/G RATIO: 1.4 (ref 1.2–2.2)
ALBUMIN: 4.2 g/dL (ref 3.5–5.5)
ALK PHOS: 70 IU/L (ref 39–117)
ALT: 13 IU/L (ref 0–32)
AST: 20 IU/L (ref 0–40)
BILIRUBIN TOTAL: 0.2 mg/dL (ref 0.0–1.2)
BUN / CREAT RATIO: 11 (ref 9–23)
BUN: 11 mg/dL (ref 6–24)
CHLORIDE: 96 mmol/L (ref 96–106)
CO2: 19 mmol/L (ref 18–29)
Calcium: 9.6 mg/dL (ref 8.7–10.2)
Creatinine, Ser: 1 mg/dL (ref 0.57–1.00)
GFR calc non Af Amer: 67 mL/min/{1.73_m2} (ref >59)
GFR, EST AFRICAN AMERICAN: 78 mL/min/{1.73_m2} (ref >59)
GLOBULIN, TOTAL: 3.1 g/dL (ref 1.5–4.5)
GLUCOSE: 67 mg/dL (ref 65–99)
POTASSIUM: 4 mmol/L (ref 3.5–5.2)
SODIUM: 139 mmol/L (ref 134–144)
TOTAL PROTEIN: 7.3 g/dL (ref 6.0–8.5)

## 2016-05-02 LAB — THYROID PANEL WITH TSH
Free Thyroxine Index: 1.9 (ref 1.2–4.9)
T3 UPTAKE RATIO: 15 % — AB (ref 24–39)
T4, Total: 12.6 ug/dL — ABNORMAL HIGH (ref 4.5–12.0)
TSH: 4.57 u[IU]/mL — ABNORMAL HIGH (ref 0.450–4.500)

## 2016-05-02 LAB — LIPID PANEL
Chol/HDL Ratio: 2.6 ratio (ref 0.0–4.4)
Cholesterol, Total: 209 mg/dL — ABNORMAL HIGH (ref 100–199)
HDL: 79 mg/dL
LDL Calculated: 103 mg/dL — ABNORMAL HIGH (ref 0–99)
Triglycerides: 136 mg/dL (ref 0–149)
VLDL Cholesterol Cal: 27 mg/dL (ref 5–40)

## 2016-05-02 LAB — VITAMIN D 25 HYDROXY (VIT D DEFICIENCY, FRACTURES): Vit D, 25-Hydroxy: 47.8 ng/mL (ref 30.0–100.0)

## 2016-05-11 IMAGING — MR MR LUMBAR SPINE W/O CM
6 series · 43 of 48 positions shown · non-contrast
Comparison: 03/26/2012

CLINICAL DATA: Degenerative disc disease, injury November 13, 2010, reinjured December 24, 2010. Low back pain and left hip pain.
Left foot numbness.

EXAM:
MRI LUMBAR SPINE WITHOUT CONTRAST
TECHNIQUE: Multiplanar, multisequence MR imaging of the lumbar spine was
performed. No intravenous contrast was administered.

[Series 2: T2 · sagittal · 4.0mm · 1.02mm/px · 5 of 17 slices shown (1 of 3)]
[im 1/17]
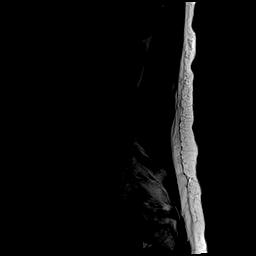
[im 5/17]
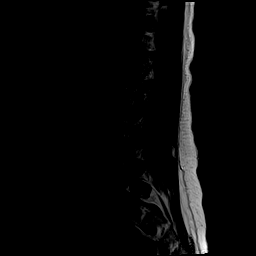
[im 9/17]
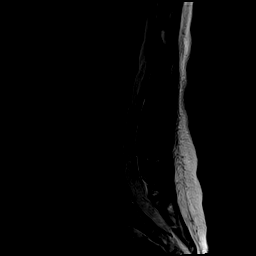
[im 13/17]
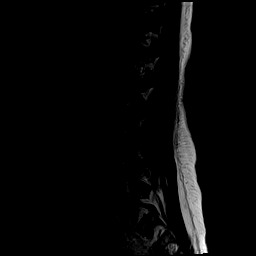
[im 17/17]
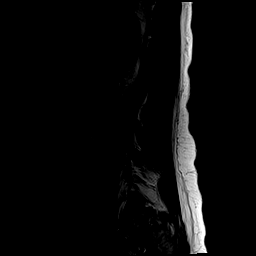

[Series 3: T1 · sagittal · 4.0mm · 1.02mm/px · 5 of 17 slices shown (1 of 2)]
[im 1/17]
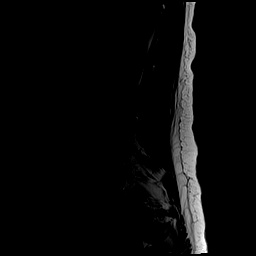
[im 5/17]
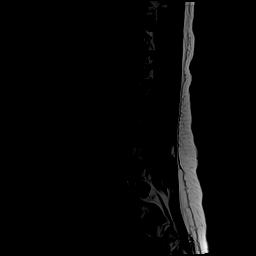
[im 9/17]
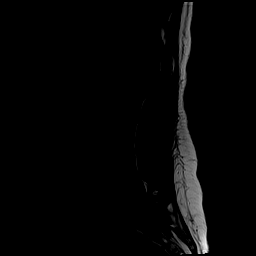
[im 13/17]
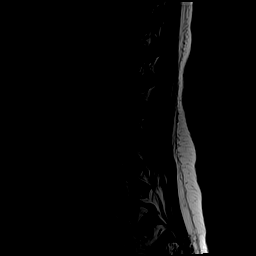
[im 17/17]
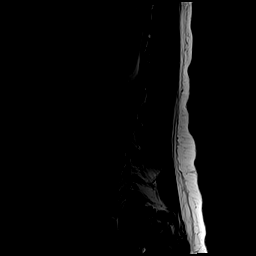

[Series 4: STIR · sagittal · 4.0mm · 1.02mm/px · 6 of 17 slices shown]
[im 1/17]
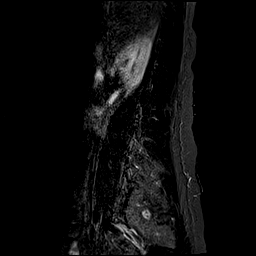
[im 4/17]
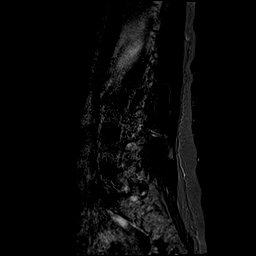
[im 7/17]
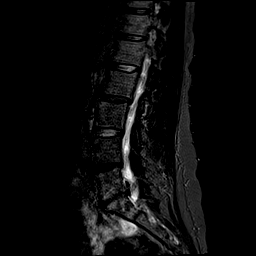
[im 10/17]
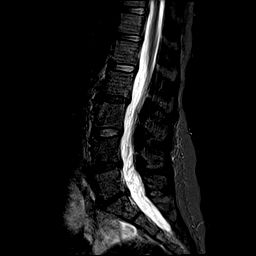
[im 13/17]
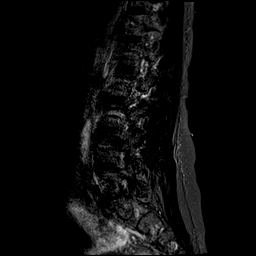
[im 17/17]
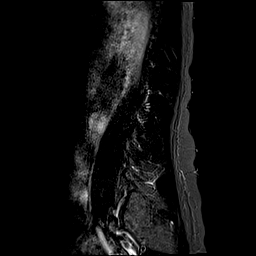

[Series 5: T2 · axial · 4.0mm · 0.78mm/px · z∈[-107,+97]mm · 13 of 38 slices shown (2 of 3)]
[im 1/38]
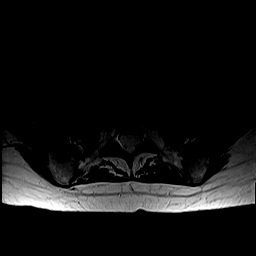
[im 4/38]
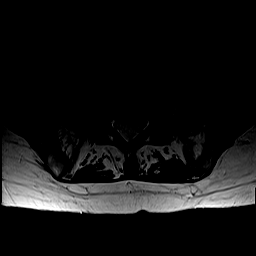
[im 7/38]
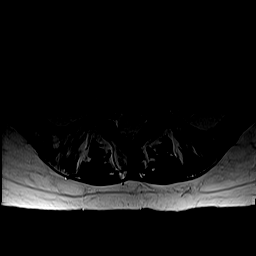
[im 10/38]
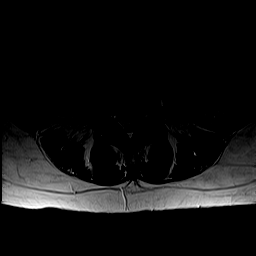
[im 13/38]
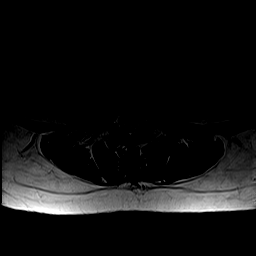
[im 16/38]
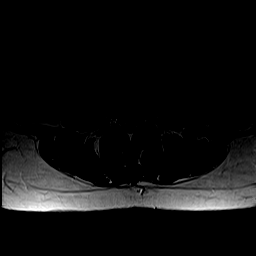
[im 19/38]
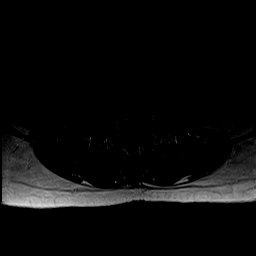
[im 22/38]
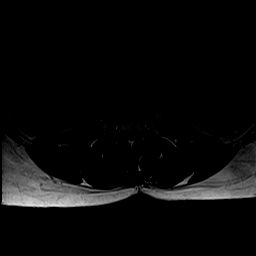
[im 25/38]
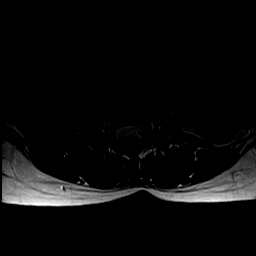
[im 28/38]
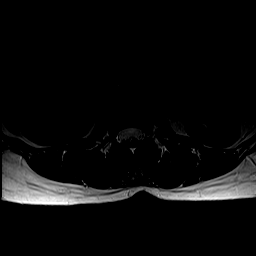
[im 31/38]
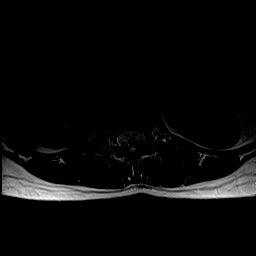
[im 34/38]
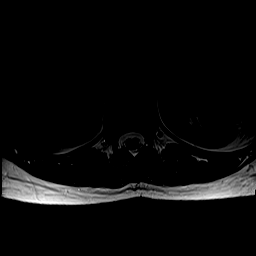
[im 38/38]
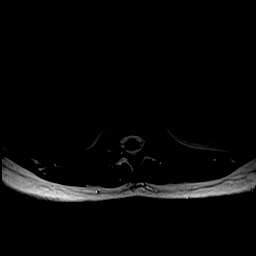

[Series 6: T1 · axial · 4.0mm · 0.39mm/px · z∈[-107,+97]mm · 8 of 38 slices shown (2 of 2)]
[im 1/38]
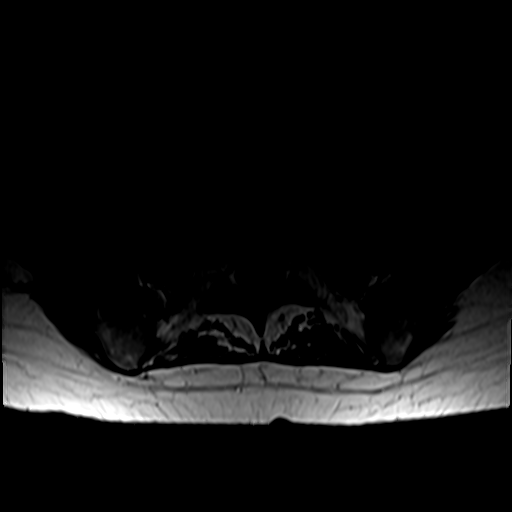
[im 7/38]
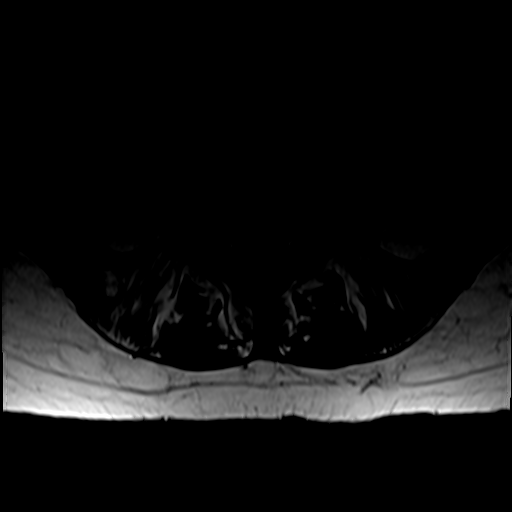
[im 13/38]
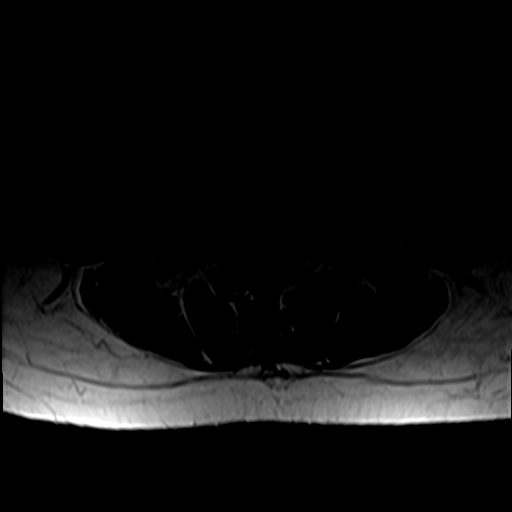
[im 16/38]
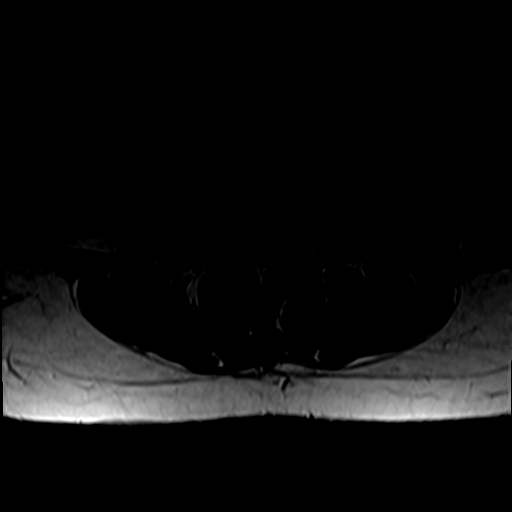
[im 22/38]
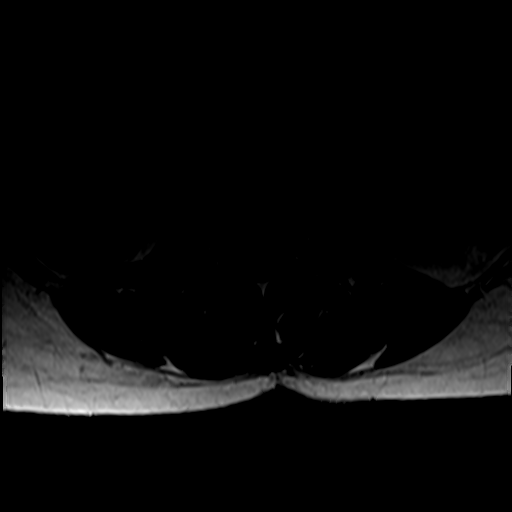
[im 25/38]
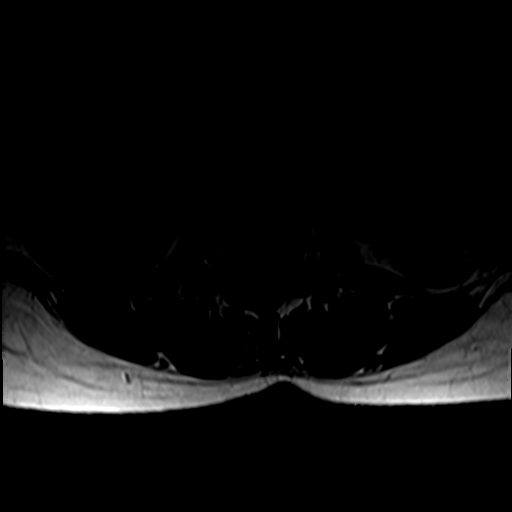
[im 31/38]
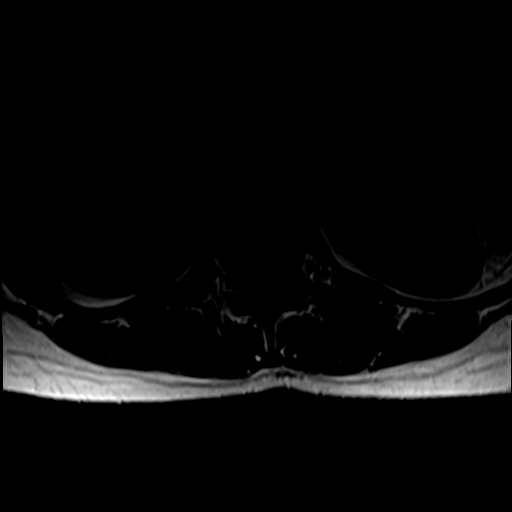
[im 38/38]
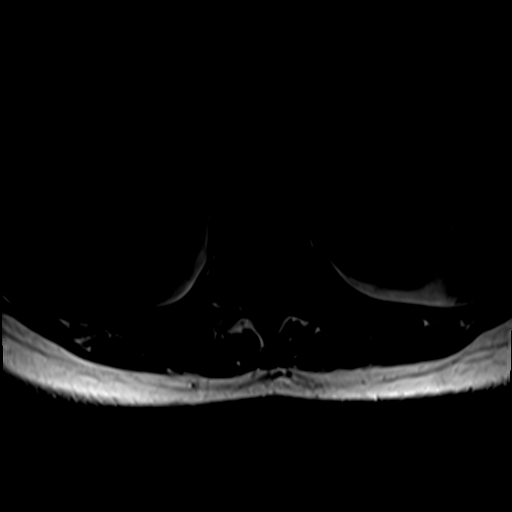

[Series 7: T2 · sagittal · 4.0mm · 1.02mm/px · 6 of 17 slices shown (3 of 3)]
[im 1/17]
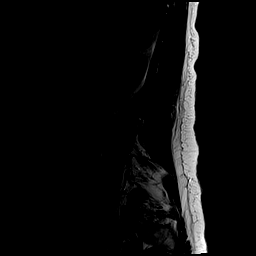
[im 4/17]
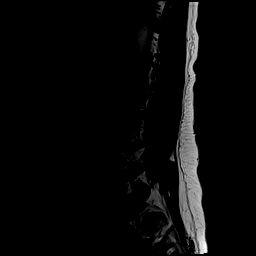
[im 7/17]
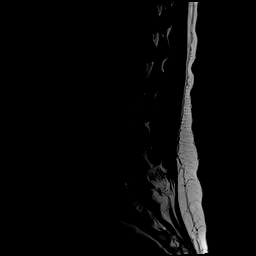
[im 10/17]
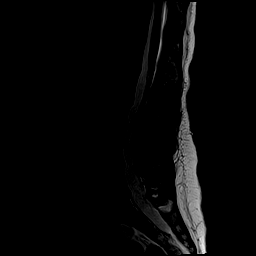
[im 13/17]
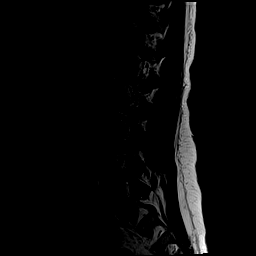
[im 17/17]
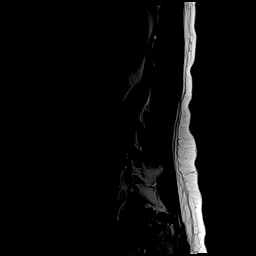

[43 of 48 positions shown; findings below may reference images not displayed]

FINDINGS: The lowest lumbar type non-rib-bearing vertebra is labeled as L5.
The conus medullaris appears normal. Conus level: L1-2.

There is 4 mm degenerative retrolisthesis at L2-3 and 3 mm
retrolisthesis at L3-4. Intervertebral disc desiccation is observed
at L2-3, L4-5, and especially L5-S1 where there is also loss of disc
height. Type 1 degenerative endplate findings noted at L5-S1.

Despite efforts by the technologist and patient, motion artifact is
present on today's exam and could not be eliminated. This reduces
exam sensitivity and specificity. Additional findings at individual
levels are as follows:

L1-2: Unremarkable.

L2-3:  No impingement.  Mild disc bulge.

L3-4: No impingement. Minimal disc bulge and minimal facet
arthropathy.

L4-5:  No impingement.  Mild disc bulge.

L5-S1: No impingement. Mild disc bulge and mild intervertebral
spurring noted.
IMPRESSION: 1. Mild lumbar spondylosis and degenerative disc disease, but
without observed impingement to explain the patient's right-sided
symptoms.

## 2016-05-28 ENCOUNTER — Other Ambulatory Visit: Payer: Self-pay | Admitting: *Deleted

## 2016-05-28 ENCOUNTER — Other Ambulatory Visit: Payer: Self-pay

## 2016-05-28 DIAGNOSIS — Z760 Encounter for issue of repeat prescription: Secondary | ICD-10-CM

## 2016-06-03 LAB — MONITOR DRUG PROFILE 14(MW)
BARBITURATE SCREEN URINE: NEGATIVE ng/mL
BUPRENORPHINE, URINE: NEGATIVE ng/mL
CANNABINOIDS UR QL SCN: NEGATIVE ng/mL
COCAINE(METAB.)SCREEN, URINE: NEGATIVE ng/mL
CREATININE(CRT), U: 109.9 mg/dL (ref 20.0–300.0)
Fentanyl, Urine: NEGATIVE pg/mL
MEPERIDINE SCREEN, URINE: NEGATIVE ng/mL
Methadone Screen, Urine: NEGATIVE ng/mL
OPIATE SCREEN URINE: NEGATIVE ng/mL
OXYCODONE+OXYMORPHONE UR QL SCN: NEGATIVE ng/mL
PHENCYCLIDINE QUANTITATIVE URINE: NEGATIVE ng/mL
Ph of Urine: 6 (ref 4.5–8.9)
Propoxyphene Scrn, Ur: NEGATIVE ng/mL
SPECIFIC GRAVITY: 1.008

## 2016-06-03 LAB — AMPHETAMINE (GC/MS), URINE
AMPHETAMINE GC/MS CONF: >4000 ng/mL
AMPHETAMINES: POSITIVE — AB
Amphetamine: POSITIVE — AB
Methamphetamine: NEGATIVE

## 2016-06-03 LAB — BENZODIAZEPINES CONFIRM, URINE
ALPRAZOLAM: NEGATIVE
BENZODIAZEPINES: POSITIVE ng/mL — AB
CLONAZEPAM: NEGATIVE
FLURAZEPAM UR: NEGATIVE
LORAZEPAM: NEGATIVE
MIDAZOLAM: NEGATIVE
NORDIAZEPAM CONFIRM: 1041 ng/mL
NORDIAZEPAM: POSITIVE — AB
OXAZEPAM GC/MS CONF: 1215 ng/mL
OXAZEPAM UR: POSITIVE — AB
TEMAZEPAM CONFIRM: 2130 ng/mL
TEMAZEPAM: POSITIVE — AB
TRIAZOLAM: NEGATIVE

## 2016-06-03 LAB — TRAMADOL (GC/MS), URINE
Tramadol (GC/MS): >3000 ng/mL
Tramadol: POSITIVE — AB

## 2016-06-26 ENCOUNTER — Other Ambulatory Visit: Payer: Self-pay | Admitting: *Deleted

## 2016-06-26 MED ORDER — VITAMIN D (ERGOCALCIFEROL) 1.25 MG (50000 UNIT) PO CAPS: 50000.0000 [IU] | ORAL_CAPSULE | ORAL | 6 refills | Status: DC

## 2016-10-23 ENCOUNTER — Other Ambulatory Visit: Payer: Self-pay | Admitting: Obstetrics and Gynecology

## 2017-05-05 ENCOUNTER — Other Ambulatory Visit: Payer: Self-pay | Admitting: Obstetrics and Gynecology

## 2017-05-06 ENCOUNTER — Encounter: Payer: Medicaid Other | Admitting: Obstetrics and Gynecology

## 2017-05-14 ENCOUNTER — Telehealth: Payer: Self-pay | Admitting: Obstetrics and Gynecology

## 2017-05-14 NOTE — Telephone Encounter (Signed)
The patient called and stated that she would like to speak with Amy in regards to the problems she is having with her B.C and he pharmacy. Please advise.

## 2017-07-29 ENCOUNTER — Encounter: Payer: Medicaid Other | Admitting: Obstetrics and Gynecology

## 2017-07-29 ENCOUNTER — Encounter: Payer: Self-pay | Admitting: *Deleted

## 2017-08-26 ENCOUNTER — Other Ambulatory Visit: Payer: Self-pay | Admitting: Obstetrics and Gynecology

## 2017-09-03 ENCOUNTER — Other Ambulatory Visit: Payer: Self-pay | Admitting: Obstetrics and Gynecology

## 2017-11-09 ENCOUNTER — Other Ambulatory Visit: Payer: Self-pay | Admitting: Obstetrics and Gynecology

## 2017-11-17 ENCOUNTER — Telehealth: Payer: Self-pay | Admitting: Obstetrics and Gynecology

## 2017-11-17 NOTE — Telephone Encounter (Signed)
The patient called upset due to her not receiving a call back from Amy and needing a medication refill as soon as possible if possible, The patient did not disclose any other information. Please advise.

## 2017-11-22 NOTE — Telephone Encounter (Addendum)
The patient called again today stating that [facility] is saying they do not have her prescription for her BCP, and she needs another script sent over, please advise, thanks.

## 2017-11-23 ENCOUNTER — Encounter: Payer: Self-pay | Admitting: *Deleted

## 2017-11-23 NOTE — Telephone Encounter (Signed)
Sent pt my chart message, called pts phone no answer or option to leave vm

## 2017-12-22 ENCOUNTER — Encounter: Payer: Medicaid Other | Admitting: Obstetrics and Gynecology

## 2018-01-07 ENCOUNTER — Encounter: Payer: Medicaid Other | Admitting: Obstetrics and Gynecology

## 2018-01-19 ENCOUNTER — Other Ambulatory Visit: Payer: Self-pay | Admitting: Obstetrics and Gynecology

## 2018-02-10 ENCOUNTER — Encounter: Payer: Medicaid Other | Admitting: Obstetrics and Gynecology

## 2018-03-10 ENCOUNTER — Encounter: Payer: Medicaid Other | Admitting: Obstetrics and Gynecology

## 2018-07-21 DIAGNOSIS — M25551 Pain in right hip: Secondary | ICD-10-CM | POA: Diagnosis not present

## 2018-07-21 DIAGNOSIS — M545 Low back pain: Secondary | ICD-10-CM | POA: Diagnosis not present

## 2018-07-21 DIAGNOSIS — R3129 Other microscopic hematuria: Secondary | ICD-10-CM | POA: Diagnosis not present

## 2018-07-21 DIAGNOSIS — D473 Essential (hemorrhagic) thrombocythemia: Secondary | ICD-10-CM | POA: Diagnosis not present

## 2018-07-21 DIAGNOSIS — M25552 Pain in left hip: Secondary | ICD-10-CM | POA: Diagnosis not present

## 2018-07-21 DIAGNOSIS — M160 Bilateral primary osteoarthritis of hip: Secondary | ICD-10-CM | POA: Diagnosis not present

## 2018-07-22 ENCOUNTER — Other Ambulatory Visit: Payer: Self-pay | Admitting: Internal Medicine

## 2018-07-22 DIAGNOSIS — G8929 Other chronic pain: Secondary | ICD-10-CM

## 2018-07-22 DIAGNOSIS — M5442 Lumbago with sciatica, left side: Secondary | ICD-10-CM

## 2018-07-29 ENCOUNTER — Ambulatory Visit: Payer: Medicaid Other

## 2018-08-08 ENCOUNTER — Ambulatory Visit: Payer: Medicaid Other

## 2018-08-15 ENCOUNTER — Other Ambulatory Visit: Payer: Self-pay | Admitting: Obstetrics and Gynecology

## 2018-08-24 ENCOUNTER — Other Ambulatory Visit: Payer: Self-pay | Admitting: Obstetrics and Gynecology

## 2018-09-05 ENCOUNTER — Telehealth: Payer: Self-pay | Admitting: *Deleted

## 2018-09-05 NOTE — Progress Notes (Deleted)
Patient's Name: Emily Lynn  MRN: [id number]  Referring Provider: Adin Hector, MD  DOB: [birth year]  PCP: Patient, No Pcp Per  DOS: 09/06/2018  Note by: Gillis Santa, MD  Service setting: Ambulatory outpatient  Specialty: Interventional Pain Management  Location: [facility] (AMB) Pain Management Facility  Visit type: Initial Patient Evaluation  Patient type: New Patient   Primary Reason(s) for Visit: Encounter for initial evaluation of one or more chronic problems (new to examiner) potentially causing chronic pain, and posing a threat to normal musculoskeletal function. (Level of risk: High) CC: No chief complaint on file.  HPI  Emily Lynn is a 48 y.o. year old, female patient, who comes today to see [place] for the first time for an initial evaluation of her chronic pain. She has Degeneration of intervertebral disc of lumbar region and Constipation due to opioid therapy on their problem list. Today she comes in for evaluation of her No chief complaint on file.  Pain Assessment: Location:     Radiating:   Onset:   Duration:   Quality:   Severity:  /10 (subjective, self-reported pain score)  Note: Reported level is compatible with observation.                         When using our objective Pain Scale, levels between 6 and 10/10 are said to belong in an emergency room, as it progressively worsens from a 6/10, described as severely limiting, requiring emergency care not usually available at an outpatient pain management facility. At a 6/10 level, communication becomes difficult and requires great effort. Assistance to reach the emergency department may be required. Facial flushing and profuse sweating along with potentially dangerous increases in heart rate and blood pressure will be evident. Effect on ADL:   Timing:   Modifying factors:   BP:    HR:    Onset and Duration: Sudden and Date of onset: 2011 Cause of pain: Work related accident or event Severity: Getting worse, NAS-11 at its worse:  10/10, NAS-11 now: 5/10 and NAS-11 on the average: 8/10 Timing: Not influenced by the time of the day Aggravating Factors: Bending, Prolonged sitting, Prolonged standing, Stooping  and Walking Alleviating Factors: Medications Associated Problems: Fatigue, Nausea, Spasms, Swelling and Pain that wakes patient up Quality of Pain: Aching, Agonizing, Annoying, Burning, Constant, Intermittent, Cramping, Cruel, Deep, Disabling, Distressing, Dull, Exhausting, Feeling of constriction, Feeling of weight, Getting longer, Getting shorter, Hot, Lancinating, Pressure-like, Pulsating, Sharp, Shooting, Sickening, Splitting, Stabbing, Tender, Tingling, Tiring, Toothache-like, Uncomfortable and Work related Previous Examinations or Tests: MRI scan, X-rays and Nerve conduction test Previous Treatments: Epidural steroid injections and Narcotic medications  The patient comes into the clinics today for the first time for a chronic pain management evaluation. ***  Today I took the time to provide the patient with information regarding my pain practice. The patient was informed that my practice is divided into two sections: an interventional pain management section, as well as a completely separate and distinct medication management section. I explained that I have procedure days for my interventional therapies, and evaluation days for follow-ups and medication management. Because of the amount of documentation required during both, they are kept separated. This means that there is the possibility that she may be scheduled for a procedure on one day, and medication management the next. I have also informed her that because of staffing and facility limitations, I no longer take patients for medication management only. To illustrate the  reasons for this, I gave the patient the example of surgeons, and how inappropriate it would be to refer a patient to his/her care, just to write for the post-surgical antibiotics on a surgery done  by a different surgeon.   Because interventional pain management is my board-certified specialty, the patient was informed that joining my practice means that they are open to any and all interventional therapies. I made it clear that this does not mean that they will be forced to have any procedures done. What this means is that I believe interventional therapies to be essential part of the diagnosis and proper management of chronic pain conditions. Therefore, patients not interested in these interventional alternatives will be better served under the care of a different practitioner.  The patient was also made aware of my Comprehensive Pain Management Safety Guidelines where by joining my practice, they limit all of their nerve blocks and joint injections to those done by our practice, for as long as we are retained to manage their care.   Historic Controlled Substance Pharmacotherapy Review  PMP and historical list of controlled substances: ***  Highest opioid analgesic regimen found: ***  Most recent opioid analgesic: ***  Current opioid analgesics:  ***  Highest recorded MME/day: *** mg/day MME/day: *** mg/day  Medications: The patient did not bring the medication(s) to the appointment, as requested in our "New Patient Package" Pharmacodynamics: Desired effects: Analgesia: The patient reports >50% benefit. Reported improvement in function: The patient reports medication allows her to accomplish basic ADLs. Clinically meaningful improvement in function (CMIF): Sustained CMIF goals met Perceived effectiveness: Described as relatively effective, allowing for increase in activities of daily living (ADL) Undesirable effects: Side-effects or Adverse reactions: None reported Historical Monitoring: The patient  reports no history of drug use. List of all UDS Test(s): No results found for: MDMA, COCAINSCRNUR, [place], [place], CANNABQUANT, [place], [place] List of other Serum/Urine Drug Screening  Test(s):  No results found for: AMPHSCRSER, BARBSCRSER, BENZOSCRSER, COCAINSCRSER, COCAINSCRNUR, PCPSCRSER, PCPQUANT, THCSCRSER, THCU, CANNABQUANT, OPIATESCRSER, OXYSCRSER, PROPOXSCRSER, ETH Historical Background Evaluation: [place] PMP: PDMP not reviewed this encounter. Six (6) year initial data search conducted.             PMP NARX Score Report:  Narcotic: *** Sedative: *** Stimulant: *** [place] Department of public safety, offender search: Editor, commissioning Information) Non-contributory Risk Assessment Profile: Aberrant behavior: None observed or detected today Risk factors for fatal opioid overdose: None identified today PMP NARX Overdose Risk Score: *** Fatal overdose hazard ratio (HR): Calculation deferred Non-fatal overdose hazard ratio (HR): Calculation deferred Risk of opioid abuse or dependence: 0.7-3.0% with doses ? 36 MME/day and 6.1-26% with doses ? 120 MME/day. Substance use disorder (SUD) risk level: See below Personal History of Substance Abuse (SUD-Substance use disorder):  Alcohol:    Illegal Drugs:    Rx Drugs:    ORT Risk Level calculation:    ORT Scoring interpretation table:  Score <3 = Low Risk for SUD  Score between 4-7 = Moderate Risk for SUD  Score >8 = High Risk for Opioid Abuse   PHQ-2 Depression Scale:  Total score:    PHQ-2 Scoring interpretation table: (Score and probability of major depressive disorder)  Score 0 = No depression  Score 1 = 15.4% Probability  Score 2 = 21.1% Probability  Score 3 = 38.4% Probability  Score 4 = 45.5% Probability  Score 5 = 56.4% Probability  Score 6 = 78.6% Probability   PHQ-9 Depression Scale:  Total score:  PHQ-9 Scoring interpretation table:  Score 0-4 = No depression  Score 5-9 = Mild depression  Score 10-14 = Moderate depression  Score 15-19 = Moderately severe depression  Score 20-27 = Severe depression (2.4 times higher risk of SUD and 2.89 times higher risk of overuse)   Pharmacologic Plan: As per protocol, I have  not taken over any controlled substance management, pending the results of ordered tests and/or consults.            Initial impression: Pending review of available data and ordered tests.  Meds   Current Outpatient Medications:  .  amphetamine-dextroamphetamine (ADDERALL) 30 MG tablet, Take 30 mg by mouth 2 (two) times daily., Disp: , Rfl:  .  cetirizine (ZYRTEC) 10 MG tablet, Take 10 mg by mouth daily., Disp: , Rfl:  .  diazepam (VALIUM) 5 MG tablet, Take 5 mg by mouth 2 (two) times daily. , Disp: , Rfl:  .  diclofenac sodium (VOLTAREN) 1 % GEL, Apply 2 g topically 4 (four) times daily., Disp: 1 Tube, Rfl: 2 .  dicyclomine (BENTYL) 20 MG tablet, Take 20 mg by mouth every 6 (six) hours. Irritable bowel, Disp: , Rfl:  .  levalbuterol (XOPENEX HFA) 45 MCG/ACT inhaler, Inhale 1-2 puffs into the lungs every 4 (four) hours as needed for wheezing., Disp: , Rfl:  .  morphine (MSIR) 30 MG tablet, Take 30 mg by mouth every 4 (four) hours as needed for severe pain., Disp: , Rfl:  .  ondansetron (ZOFRAN) 8 MG tablet, TAKE ONE (1) TABLET BY MOUTH EVERY SIX HOURS AS NEEDED FOR NAUSEA, Disp: 40 tablet, Rfl: 1 .  phenazopyridine (PYRIDIUM) 200 MG tablet, Take 1 tablet (200 mg total) by mouth 3 (three) times daily as needed for pain (urethral spasm)., Disp: 30 tablet, Rfl: 1 .  sucralfate (CARAFATE) 1 G tablet, Take 1 g by mouth 3 (three) times daily., Disp: , Rfl:  .  SYNTHROID 50 MCG tablet, Take 1 tablet (50 mcg total) by mouth daily before breakfast., Disp: 30 tablet, Rfl: 6 .  traMADol (ULTRAM) 50 MG tablet, Take 2 tablets (100 mg total) by mouth every 6 (six) hours as needed., Disp: 90 tablet, Rfl: 0 .  Vitamin D, Ergocalciferol, (DRISDOL) 50000 units CAPS capsule, TAKE ONE (1) CAPSULE BY MOUTH TWICE A WEEK., Disp: 8 capsule, Rfl: 4 .  YASMIN 28 3-0.03 MG tablet, TAKE 1 TABLET BY MOUTH DAILY, Disp: 84 tablet, Rfl: 0  Imaging Review  Cervical Imaging: Cervical MR wo contrast: No results found for this  or any previous visit. Cervical MR wo contrast: No procedure found. Cervical MR w/wo contrast: No results found for this or any previous visit. Cervical MR w contrast: No results found for this or any previous visit. Cervical CT wo contrast: No results found for this or any previous visit. Cervical CT w/wo contrast: No results found for this or any previous visit. Cervical CT w/wo contrast: No results found for this or any previous visit. Cervical CT w contrast: No results found for this or any previous visit. Cervical CT outside: No results found for this or any previous visit. Cervical DG 1 view: No results found for this or any previous visit. Cervical DG 2-3 views: No results found for this or any previous visit. Cervical DG F/E views: No results found for this or any previous visit. Cervical DG 2-3 clearing views: No results found for this or any previous visit. Cervical DG Bending/F/E views: No results found for this or any  previous visit. Cervical DG complete: No results found for this or any previous visit. Cervical DG Myelogram views: No results found for this or any previous visit. Cervical DG Myelogram views: No results found for this or any previous visit. Cervical Discogram views: No results found for this or any previous visit.  Shoulder Imaging: Shoulder-R MR w contrast: No results found for this or any previous visit. Shoulder-L MR w contrast: No results found for this or any previous visit. Shoulder-R MR w/wo contrast: No results found for this or any previous visit. Shoulder-L MR w/wo contrast: No results found for this or any previous visit. Shoulder-R MR wo contrast:  Results for orders placed during the hospital encounter of 02/12/11  MR Shoulder Right Wo Contrast   Narrative *RADIOLOGY REPORT*  Clinical Data: Shoulder injury September, 2012.  Continued pain.  MRI OF THE RIGHT SHOULDER WITHOUT CONTRAST  Technique:  Multiplanar, multisequence MR imaging was  performed. No intravenous contrast was administered.  Comparison: Plain films 12/10/2010.  Findings: The patient has a very mild, heterogeneous T2 signal in the supraspinatus and infraspinatus tendons compatible with tendinopathy.  There is no tear.  The subscapularis is normal appearance.  The long head of biceps tendon is intact.  The glenoid labrum appears normal.  No notable acromioclavicular degenerative change is present.  The acromion is type 1.  There is a trace amount of fluid in the subacromial/subdeltoid bursa.  No evidence of recent or remote fracture is identified.  IMPRESSION:  1.  Negative for evidence of post-traumatic change. 2.  Very mild appearing supraspinatus and infraspinatus tendinopathy.  There is no rotator cuff tear. 3.  Small amount of fluid the subacromial/subdeltoid bursa could be due to bursitis.  Original Report Authenticated By: Arvid Right. Luther Parody, M.D.   Shoulder-L MR wo contrast: No results found for this or any previous visit. Shoulder-R CT w contrast: No results found for this or any previous visit. Shoulder-L CT w contrast: No results found for this or any previous visit. Shoulder-R CT w/wo contrast: No results found for this or any previous visit. Shoulder-L CT w/wo contrast: No results found for this or any previous visit. Shoulder-R CT wo contrast: No results found for this or any previous visit. Shoulder-L CT wo contrast: No results found for this or any previous visit. Shoulder-R DG Arthrogram: No results found for this or any previous visit. Shoulder-L DG Arthrogram: No results found for this or any previous visit. Shoulder-R DG 1 view: No results found for this or any previous visit. Shoulder-L DG 1 view: No results found for this or any previous visit. Shoulder-R DG:  Results for orders placed in visit on 12/10/10  DG Shoulder Right   Narrative *RADIOLOGY REPORT*  Clinical Data: Trauma on 11/13/2010, persistent right shoulder  pain  RIGHT SHOULDER - 2+ VIEW  Comparison: None.  Findings: The right humeral head is in normal position.  The glenohumeral joint space appears normal.  The right AC joint is normally aligned.  No acute bony abnormality is seen.  IMPRESSION: Negative right shoulder.  Original Report Authenticated By: Joretta Bachelor, M.D.   Shoulder-L DG: No results found for this or any previous visit.  Thoracic Imaging: Thoracic MR wo contrast: No results found for this or any previous visit. Thoracic MR wo contrast: No procedure found. Thoracic MR w/wo contrast: No results found for this or any previous visit. Thoracic MR w contrast: No results found for this or any previous visit. Thoracic CT wo contrast: No results  found for this or any previous visit. Thoracic CT w/wo contrast: No results found for this or any previous visit. Thoracic CT w/wo contrast: No results found for this or any previous visit. Thoracic CT w contrast: No results found for this or any previous visit. Thoracic DG 2-3 views: No results found for this or any previous visit. Thoracic DG 4 views: No results found for this or any previous visit. Thoracic DG: No results found for this or any previous visit. Thoracic DG w/swimmers view: No results found for this or any previous visit. Thoracic DG Myelogram views: No results found for this or any previous visit. Thoracic DG Myelogram views: No results found for this or any previous visit.  Lumbosacral Imaging: Lumbar MR wo contrast:  Results for orders placed during the hospital encounter of 03/11/15  MR Lumbar Spine Wo Contrast   Narrative CLINICAL DATA:  Degenerative disc disease, injury November 13, 2010, reinjured December 24, 2010. Low back pain and left hip pain. Left foot numbness.  EXAM: MRI LUMBAR SPINE WITHOUT CONTRAST  TECHNIQUE: Multiplanar, multisequence MR imaging of the lumbar spine was performed. No intravenous contrast was administered.  COMPARISON:   03/26/2012  FINDINGS: The lowest lumbar type non-rib-bearing vertebra is labeled as L5. The conus medullaris appears normal. Conus level: L1-2.  There is 4 mm degenerative retrolisthesis at L2-3 and 3 mm retrolisthesis at L3-4. Intervertebral disc desiccation is observed at L2-3, L4-5, and especially L5-S1 where there is also loss of disc height. Type 1 degenerative endplate findings noted at L5-S1.  Despite efforts by the technologist and patient, motion artifact is present on today's exam and could not be eliminated. This reduces exam sensitivity and specificity. Additional findings at individual levels are as follows:  L1-2: Unremarkable.  L2-3:  No impingement.  Mild disc bulge.  L3-4: No impingement. Minimal disc bulge and minimal facet arthropathy.  L4-5:  No impingement.  Mild disc bulge.  L5-S1: No impingement. Mild disc bulge and mild intervertebral spurring noted.  IMPRESSION: 1. Mild lumbar spondylosis and degenerative disc disease, but without observed impingement to explain the patient's right-sided symptoms.   Electronically Signed   By: Van Clines M.D.   On: 03/11/2015 17:22    Lumbar MR wo contrast: No procedure found. Lumbar MR w/wo contrast: No results found for this or any previous visit. Lumbar MR w/wo contrast: No results found for this or any previous visit. Lumbar MR w contrast: No results found for this or any previous visit. Lumbar CT wo contrast: No results found for this or any previous visit. Lumbar CT w/wo contrast: No results found for this or any previous visit. Lumbar CT w/wo contrast: No results found for this or any previous visit. Lumbar CT w contrast: No results found for this or any previous visit. Lumbar DG 1V: No results found for this or any previous visit. Lumbar DG 1V (Clearing): No results found for this or any previous visit. Lumbar DG 2-3V (Clearing): No results found for this or any previous visit. Lumbar DG 2-3  views: No results found for this or any previous visit. Lumbar DG (Complete) 4+V:  Results for orders placed during the hospital encounter of 01/06/11  DG Lumbar Spine Complete   Narrative *RADIOLOGY REPORT*  Clinical Data: Injured lifting patient with low back pain radiating to the right leg  LUMBAR SPINE - COMPLETE 4+ VIEW  Comparison: None.  Findings: The lumbar vertebrae are in normal alignment.  There is degenerative disc disease at L5-S1 with loss  of disc space and sclerosis with vacuum disc phenomenon.  No compression deformity is seen.  The SI joints appear normal.  IMPRESSION: Degenerative disc disease at L5-S1.  Normal alignment.  Original Report Authenticated By: Joretta Bachelor, M.D.         Lumbar DG F/E views: No results found for this or any previous visit.       Lumbar DG Bending views: No results found for this or any previous visit.       Lumbar DG Myelogram views: No results found for this or any previous visit. Lumbar DG Myelogram: No results found for this or any previous visit. Lumbar DG Myelogram: No results found for this or any previous visit. Lumbar DG Myelogram: No results found for this or any previous visit. Lumbar DG Myelogram Lumbosacral: No results found for this or any previous visit. Lumbar DG Diskogram views: No results found for this or any previous visit. Lumbar DG Diskogram views: No results found for this or any previous visit. Lumbar DG Epidurogram OP: No results found for this or any previous visit. Lumbar DG Epidurogram IP: No results found for this or any previous visit.  Sacroiliac Joint Imaging: Sacroiliac Joint DG: No results found for this or any previous visit. Sacroiliac Joint MR w/wo contrast: No results found for this or any previous visit. Sacroiliac Joint MR wo contrast: No results found for this or any previous visit.  Spine Imaging: Whole Spine DG Myelogram views: No results found for this or any previous visit. Whole  Spine MR Mets screen: No results found for this or any previous visit. Whole Spine MR Mets screen: No results found for this or any previous visit. Whole Spine MR w/wo: No results found for this or any previous visit. MRA Spinal Canal w/ cm: No results found for this or any previous visit. MRA Spinal Canal wo/ cm: No procedure found. MRA Spinal Canal w/wo cm: No results found for this or any previous visit. Spine Outside MR Films: No results found for this or any previous visit. Spine Outside CT Films: No results found for this or any previous visit. CT-Guided Biopsy: No results found for this or any previous visit. CT-Guided Needle Placement: No results found for this or any previous visit. DG Spine outside: No results found for this or any previous visit. IR Spine outside: No results found for this or any previous visit. NM Spine outside: No results found for this or any previous visit.  Hip Imaging: Hip-R MR w contrast: No results found for this or any previous visit. Hip-L MR w contrast: No results found for this or any previous visit. Hip-R MR w/wo contrast: No results found for this or any previous visit. Hip-L MR w/wo contrast: No results found for this or any previous visit. Hip-R MR wo contrast: No results found for this or any previous visit. Hip-L MR wo contrast: No results found for this or any previous visit. Hip-R CT w contrast: No results found for this or any previous visit. Hip-L CT w contrast: No results found for this or any previous visit. Hip-R CT w/wo contrast: No results found for this or any previous visit. Hip-L CT w/wo contrast: No results found for this or any previous visit. Hip-R CT wo contrast: No results found for this or any previous visit. Hip-L CT wo contrast: No results found for this or any previous visit. Hip-R DG 2-3 views: No results found for this or any previous visit. Hip-L DG 2-3 views:  No results found for this or any previous visit. Hip-R DG  Arthrogram: No results found for this or any previous visit. Hip-L DG Arthrogram: No results found for this or any previous visit. Hip-B DG Bilateral: No results found for this or any previous visit.  Knee Imaging: Knee-R MR w contrast: No results found for this or any previous visit. Knee-L MR w contrast: No results found for this or any previous visit. Knee-R MR w/wo contrast: No results found for this or any previous visit. Knee-L MR w/wo contrast: No results found for this or any previous visit. Knee-R MR wo contrast: No results found for this or any previous visit. Knee-L MR wo contrast: No results found for this or any previous visit. Knee-R CT w contrast: No results found for this or any previous visit. Knee-L CT w contrast: No results found for this or any previous visit. Knee-R CT w/wo contrast: No results found for this or any previous visit. Knee-L CT w/wo contrast: No results found for this or any previous visit. Knee-R CT wo contrast: No results found for this or any previous visit. Knee-L CT wo contrast: No results found for this or any previous visit. Knee-R DG 1-2 views: No results found for this or any previous visit. Knee-L DG 1-2 views: No results found for this or any previous visit. Knee-R DG 3 views: No results found for this or any previous visit. Knee-L DG 3 views: No results found for this or any previous visit. Knee-R DG 4 views: No results found for this or any previous visit. Knee-L DG 4 views: No results found for this or any previous visit. Knee-R DG Arthrogram: No results found for this or any previous visit. Knee-L DG Arthrogram: No results found for this or any previous visit.  Ankle Imaging: Ankle-R DG Complete: No results found for this or any previous visit. Ankle-L DG Complete: No results found for this or any previous visit.  Foot Imaging: Foot-R DG Complete: No results found for this or any previous visit. Foot-L DG Complete: No results found for  this or any previous visit.  Elbow Imaging: Elbow-R DG Complete: No results found for this or any previous visit. Elbow-L DG Complete: No results found for this or any previous visit.  Wrist Imaging: Wrist-R DG Complete: No results found for this or any previous visit. Wrist-L DG Complete: No results found for this or any previous visit.  Hand Imaging: Hand-R DG Complete: No results found for this or any previous visit. Hand-L DG Complete: No results found for this or any previous visit.  Complexity Note: Imaging results reviewed. Results shared with Ms. Runner, using Layman's terms.                         ROS  Cardiovascular: High blood pressure Pulmonary or Respiratory: Wheezing and difficulty taking a deep full breath (Asthma) Neurological: No reported neurological signs or symptoms such as seizures, abnormal skin sensations, urinary and/or fecal incontinence, being born with an abnormal open spine and/or a tethered spinal cord Review of Past Neurological Studies: No results found for this or any previous visit. Psychological-Psychiatric: Depressed Gastrointestinal: Reflux or heatburn Genitourinary: Peeing blood Hematological: No reported hematological signs or symptoms such as prolonged bleeding, low or poor functioning platelets, bruising or bleeding easily, hereditary bleeding problems, low energy levels due to low hemoglobin or being anemic Endocrine: No reported endocrine signs or symptoms such as high or low blood sugar, rapid heart  rate due to high thyroid levels, obesity or weight gain due to slow thyroid or thyroid disease Rheumatologic: Rheumatoid arthritis Musculoskeletal: Negative for myasthenia gravis, muscular dystrophy, multiple sclerosis or malignant hyperthermia Work History: Disabled  Allergies  Ms. Treadwell is allergic to stadol [butorphanol]; demerol [meperidine]; imitrex [sumatriptan]; influenza vaccines; latex; lidocaine; morphine and related; phenergan  [promethazine hcl]; procardia [nifedipine]; terazol [terconazole]; and tetanus toxoids.  Laboratory Chemistry   SAFETY SCREENING Profile No results found for: SARSCOV2NAA, COVIDSOURCE, STAPHAUREUS, MRSAPCR, HCVAB, HIV, PREGTESTUR Inflammation Markers (CRP: Acute Phase) (ESR: Chronic Phase) No results found for: CRP, ESRSEDRATE, LATICACIDVEN                       Rheumatology Markers No results found for: RF, ANA, LABURIC, URICUR, LYMEIGGIGMAB, LYMEABIGMQN, HLAB27                      Renal Function Markers Lab Results  Component Value Date   BUN 11 04/30/2016   CREATININE 1.00 04/30/2016   BCR 11 04/30/2016   GFRAA 78 04/30/2016   GFRNONAA 67 04/30/2016                             Hepatic Function Markers Lab Results  Component Value Date   AST 20 04/30/2016   ALT 13 04/30/2016   ALBUMIN 4.2 04/30/2016   ALKPHOS 70 04/30/2016                        Electrolytes Lab Results  Component Value Date   NA 139 04/30/2016   K 4.0 04/30/2016   CL 96 04/30/2016   CALCIUM 9.6 04/30/2016   MG 1.9 12/25/2015                        Neuropathy Markers No results found for: VITAMINB12, FOLATE, HGBA1C, HIV                      CNS Tests No results found for: COLORCSF, APPEARCSF, RBCCOUNTCSF, WBCCSF, POLYSCSF, LYMPHSCSF, EOSCSF, PROTEINCSF, GLUCCSF, JCVIRUS, CSFOLI, IGGCSF, LABACHR, ACETBL                      Bone Pathology Markers Lab Results  Component Value Date   VD25OH 47.8 04/30/2016                         Coagulation Parameters Lab Results  Component Value Date   PLT 387 (H) 04/30/2016                        Cardiovascular Markers Lab Results  Component Value Date   HGB 13.9 04/30/2016   HCT 41.1 04/30/2016                         ID Test(s) Lab Results  Component Value Date   MICROTEXT  02/18/2012       COMMENT                   NO GROWTH AEROBICALLY/ANAEROBICALLY IN 5 DAYS   ANTIBIOTIC  CA  Markers No results found for: CEA, CA125, LABCA2                      Endocrine Markers Lab Results  Component Value Date   TSH 4.570 (H) 04/30/2016                        Note: Lab results reviewed.  PFSH  Drug: Ms. Vanengen  reports no history of drug use. Alcohol:  reports no history of alcohol use. Tobacco:  reports that she has never smoked. She has never used smokeless tobacco. Medical:  has a past medical history of ADHD (attention deficit hyperactivity disorder), ADHD (attention deficit hyperactivity disorder), Arthritis, Asthma, Back pain, Chronic back pain, and Gastric polyp. Family: family history includes Cancer in her maternal aunt and paternal aunt; Diabetes in her brother, mother, and sister; Heart disease in her father and mother.  Past Surgical History:  Procedure Laterality Date  . APPENDECTOMY    . CHOLECYSTECTOMY    . KNEE SURGERY    . OVARIAN CYST REMOVAL     Active Ambulatory Problems    Diagnosis Date Noted  . Degeneration of intervertebral disc of lumbar region 02/21/2015  . Constipation due to opioid therapy 02/21/2015   Resolved Ambulatory Problems    Diagnosis Date Noted  . No Resolved Ambulatory Problems   Past Medical History:  Diagnosis Date  . ADHD (attention deficit hyperactivity disorder)   . ADHD (attention deficit hyperactivity disorder)   . Arthritis   . Asthma   . Back pain   . Chronic back pain   . Gastric polyp    Constitutional Exam  General appearance: Well nourished, well developed, and well hydrated. In no apparent acute distress There were no vitals filed for this visit. BMI Assessment: Estimated body mass index is 23.38 kg/m as calculated from the following:   Height as of 04/30/16: _0  (1.651 m).   Weight as of 04/30/16: 140 lb 8 oz (63.7 kg).  BMI interpretation table: BMI level Category Range association with higher incidence of chronic pain  <18 kg/m2 Underweight   18.5-24.9 kg/m2 Ideal body weight   25-29.9 kg/m2  Overweight Increased incidence by 20%  30-34.9 kg/m2 Obese (Class I) Increased incidence by 68%  35-39.9 kg/m2 Severe obesity (Class II) Increased incidence by 136%  >40 kg/m2 Extreme obesity (Class III) Increased incidence by 254%   Patient's current BMI Ideal Body weight  There is no height or weight on file to calculate BMI. Patient weight not recorded   BMI Readings from Last 4 Encounters:  04/30/16 23.38 kg/m  12/25/15 22.33 kg/m  03/11/15 21.63 kg/m  02/21/15 22.63 kg/m   Wt Readings from Last 4 Encounters:  04/30/16 140 lb 8 oz (63.7 kg)  12/25/15 134 lb 3.2 oz (60.9 kg)  03/11/15 130 lb (59 kg)  02/21/15 136 lb (61.7 kg)  Psych/Mental status: Alert, oriented x 3 (person, place, & time)       Eyes: PERLA Respiratory: No evidence of acute respiratory distress  Cervical Spine Area Exam  Skin & Axial Inspection: No masses, redness, edema, swelling, or associated skin lesions Alignment: Symmetrical Functional ROM: Unrestricted ROM      Stability: No instability detected Muscle Tone/Strength: Functionally intact. No obvious neuro-muscular anomalies detected. Sensory (Neurological): Unimpaired Palpation: No palpable anomalies              Upper Extremity (UE) Exam    Side: Right upper  extremity  Side: Left upper extremity  Skin & Extremity Inspection: Skin color, temperature, and hair growth are WNL. No peripheral edema or cyanosis. No masses, redness, swelling, asymmetry, or associated skin lesions. No contractures.  Skin & Extremity Inspection: Skin color, temperature, and hair growth are WNL. No peripheral edema or cyanosis. No masses, redness, swelling, asymmetry, or associated skin lesions. No contractures.  Functional ROM: Unrestricted ROM          Functional ROM: Unrestricted ROM          Muscle Tone/Strength: Functionally intact. No obvious neuro-muscular anomalies detected.  Muscle Tone/Strength: Functionally intact. No obvious neuro-muscular anomalies detected.   Sensory (Neurological): Unimpaired          Sensory (Neurological): Unimpaired          Palpation: No palpable anomalies              Palpation: No palpable anomalies              Provocative Test(s):  Phalen's test: deferred Tinel's test: deferred Apley's scratch test (touch opposite shoulder):  Action 1 (Across chest): deferred Action 2 (Overhead): deferred Action 3 (LB reach): deferred   Provocative Test(s):  Phalen's test: deferred Tinel's test: deferred Apley's scratch test (touch opposite shoulder):  Action 1 (Across chest): deferred Action 2 (Overhead): deferred Action 3 (LB reach): deferred    Thoracic Spine Area Exam  Skin & Axial Inspection: No masses, redness, or swelling Alignment: Symmetrical Functional ROM: Unrestricted ROM Stability: No instability detected Muscle Tone/Strength: Functionally intact. No obvious neuro-muscular anomalies detected. Sensory (Neurological): Unimpaired Muscle strength & Tone: No palpable anomalies  Lumbar Spine Area Exam  Skin & Axial Inspection: No masses, redness, or swelling Alignment: Symmetrical Functional ROM: Unrestricted ROM       Stability: No instability detected Muscle Tone/Strength: Functionally intact. No obvious neuro-muscular anomalies detected. Sensory (Neurological): Unimpaired Palpation: No palpable anomalies       Provocative Tests: Hyperextension/rotation test: deferred today       Lumbar quadrant test (Kemp's test): deferred today       Lateral bending test: deferred today       Patrick's Maneuver: deferred today                   FABER* test: deferred today                   S-I anterior distraction/compression test: deferred today         S-I lateral compression test: deferred today         S-I Thigh-thrust test: deferred today         S-I Gaenslen's test: deferred today         *(Flexion, ABduction and External Rotation)  Gait & Posture Assessment  Ambulation: Unassisted Gait: Relatively normal for  age and body habitus Posture: WNL   Lower Extremity Exam    Side: Right lower extremity  Side: Left lower extremity  Stability: No instability observed          Stability: No instability observed          Skin & Extremity Inspection: Skin color, temperature, and hair growth are WNL. No peripheral edema or cyanosis. No masses, redness, swelling, asymmetry, or associated skin lesions. No contractures.  Skin & Extremity Inspection: Skin color, temperature, and hair growth are WNL. No peripheral edema or cyanosis. No masses, redness, swelling, asymmetry, or associated skin lesions. No contractures.  Functional ROM: Unrestricted ROM  Functional ROM: Unrestricted ROM                  Muscle Tone/Strength: Functionally intact. No obvious neuro-muscular anomalies detected.  Muscle Tone/Strength: Functionally intact. No obvious neuro-muscular anomalies detected.  Sensory (Neurological): Unimpaired        Sensory (Neurological): Unimpaired        DTR: Patellar: deferred today Achilles: deferred today Plantar: deferred today  DTR: Patellar: deferred today Achilles: deferred today Plantar: deferred today  Palpation: No palpable anomalies  Palpation: No palpable anomalies   Assessment  Primary Diagnosis & Pertinent Problem List: There were no encounter diagnoses.  Visit Diagnosis (New problems to examiner): No diagnosis found. Plan of Care (Initial workup plan)  Note: Ms. Vancamp was reminded that as per protocol, today's visit has been an evaluation only. We have not taken over the patient's controlled substance management.  Problem-specific plan: No problem-specific Assessment & Plan notes found for this encounter.  Lab Orders  No laboratory test(s) ordered today   Imaging Orders  No imaging studies ordered today   Referral Orders  No referral(s) requested today   Procedure Orders    No procedure(s) ordered today   Pharmacotherapy (current): Medications ordered:  No  orders of the defined types were placed in this encounter.  Medications administered during this visit: Alaycia A. Fikes had no medications administered during this visit.   Pharmacological management options:  Opioid Analgesics: The patient was informed that there is no guarantee that she would be a candidate for opioid analgesics. The decision will be made following CDC guidelines. This decision will be based on the results of diagnostic studies, as well as Ms. Mcguinn's risk profile.   Membrane stabilizer: To be determined at a later time  Muscle relaxant: To be determined at a later time  NSAID: To be determined at a later time  Other analgesic(s): To be determined at a later time   Interventional management options: Ms. Nakamura was informed that there is no guarantee that she would be a candidate for interventional therapies. The decision will be based on the results of diagnostic studies, as well as Ms. Upshur's risk profile.  Procedure(s) under consideration:  ***   Provider-requested follow-up: No follow-ups on file.  Future Appointments  Date Time Provider [place]  09/06/2018  2:00 PM Gillis Santa, MD [facility] None    Primary Care Physician: Patient, No Pcp Per Location: [facility] Note by: Gillis Santa, MD Date: 09/06/2018; Time: 2:12 PM  Note: This dictation was prepared with Dragon dictation. Any transcriptional errors that may result from this process are unintentional.

## 2018-09-06 ENCOUNTER — Ambulatory Visit: Payer: PRIVATE HEALTH INSURANCE | Admitting: Student in an Organized Health Care Education/Training Program

## 2018-10-13 ENCOUNTER — Other Ambulatory Visit: Payer: Self-pay | Admitting: Certified Nurse Midwife

## 2018-11-25 ENCOUNTER — Other Ambulatory Visit: Payer: Self-pay | Admitting: Certified Nurse Midwife

## 2019-01-06 ENCOUNTER — Telehealth: Payer: Self-pay | Admitting: Orthopedic Surgery

## 2019-01-06 NOTE — Telephone Encounter (Signed)
Called patient no answer and no answering machine pickup  °

## 2019-01-24 DIAGNOSIS — M545 Low back pain: Secondary | ICD-10-CM | POA: Diagnosis not present

## 2019-01-26 ENCOUNTER — Ambulatory Visit: Payer: PRIVATE HEALTH INSURANCE | Admitting: Orthopaedic Surgery

## 2019-02-20 DIAGNOSIS — M545 Low back pain: Secondary | ICD-10-CM | POA: Diagnosis not present

## 2019-02-28 ENCOUNTER — Ambulatory Visit: Payer: PRIVATE HEALTH INSURANCE | Admitting: Orthopaedic Surgery

## 2019-03-07 DIAGNOSIS — M545 Low back pain: Secondary | ICD-10-CM | POA: Diagnosis not present

## 2019-03-07 DIAGNOSIS — M5117 Intervertebral disc disorders with radiculopathy, lumbosacral region: Secondary | ICD-10-CM | POA: Diagnosis not present

## 2019-03-28 DIAGNOSIS — G5601 Carpal tunnel syndrome, right upper limb: Secondary | ICD-10-CM | POA: Diagnosis not present

## 2019-03-28 DIAGNOSIS — M5417 Radiculopathy, lumbosacral region: Secondary | ICD-10-CM | POA: Diagnosis not present

## 2019-04-18 DIAGNOSIS — G8929 Other chronic pain: Secondary | ICD-10-CM | POA: Diagnosis not present

## 2019-04-18 DIAGNOSIS — M5412 Radiculopathy, cervical region: Secondary | ICD-10-CM | POA: Diagnosis not present

## 2019-04-18 DIAGNOSIS — Z79899 Other long term (current) drug therapy: Secondary | ICD-10-CM | POA: Diagnosis not present

## 2019-04-18 DIAGNOSIS — M542 Cervicalgia: Secondary | ICD-10-CM | POA: Diagnosis not present

## 2019-04-18 DIAGNOSIS — G5601 Carpal tunnel syndrome, right upper limb: Secondary | ICD-10-CM | POA: Diagnosis not present

## 2019-05-16 DIAGNOSIS — Z79899 Other long term (current) drug therapy: Secondary | ICD-10-CM | POA: Diagnosis not present

## 2019-05-16 DIAGNOSIS — G5601 Carpal tunnel syndrome, right upper limb: Secondary | ICD-10-CM | POA: Diagnosis not present

## 2019-05-16 DIAGNOSIS — G8929 Other chronic pain: Secondary | ICD-10-CM | POA: Diagnosis not present

## 2019-05-16 DIAGNOSIS — M542 Cervicalgia: Secondary | ICD-10-CM | POA: Diagnosis not present

## 2019-05-16 DIAGNOSIS — M5412 Radiculopathy, cervical region: Secondary | ICD-10-CM | POA: Diagnosis not present

## 2019-07-18 DIAGNOSIS — G8929 Other chronic pain: Secondary | ICD-10-CM | POA: Diagnosis not present

## 2019-07-18 DIAGNOSIS — G5601 Carpal tunnel syndrome, right upper limb: Secondary | ICD-10-CM | POA: Diagnosis not present

## 2019-07-18 DIAGNOSIS — M5412 Radiculopathy, cervical region: Secondary | ICD-10-CM | POA: Diagnosis not present

## 2019-07-18 DIAGNOSIS — M542 Cervicalgia: Secondary | ICD-10-CM | POA: Diagnosis not present

## 2019-10-24 DIAGNOSIS — M25562 Pain in left knee: Secondary | ICD-10-CM | POA: Diagnosis not present

## 2019-10-24 DIAGNOSIS — M5417 Radiculopathy, lumbosacral region: Secondary | ICD-10-CM | POA: Diagnosis not present

## 2019-10-24 DIAGNOSIS — G5603 Carpal tunnel syndrome, bilateral upper limbs: Secondary | ICD-10-CM | POA: Diagnosis not present

## 2019-10-24 DIAGNOSIS — G43711 Chronic migraine without aura, intractable, with status migrainosus: Secondary | ICD-10-CM | POA: Diagnosis not present

## 2019-10-24 DIAGNOSIS — M25561 Pain in right knee: Secondary | ICD-10-CM | POA: Diagnosis not present

## 2019-10-24 DIAGNOSIS — M5412 Radiculopathy, cervical region: Secondary | ICD-10-CM | POA: Diagnosis not present

## 2019-10-24 DIAGNOSIS — Z79899 Other long term (current) drug therapy: Secondary | ICD-10-CM | POA: Diagnosis not present

## 2019-10-24 DIAGNOSIS — F988 Other specified behavioral and emotional disorders with onset usually occurring in childhood and adolescence: Secondary | ICD-10-CM | POA: Diagnosis not present

## 2020-02-07 DIAGNOSIS — M542 Cervicalgia: Secondary | ICD-10-CM | POA: Diagnosis not present

## 2020-02-07 DIAGNOSIS — M545 Low back pain, unspecified: Secondary | ICD-10-CM | POA: Diagnosis not present

## 2020-02-07 DIAGNOSIS — M5412 Radiculopathy, cervical region: Secondary | ICD-10-CM | POA: Diagnosis not present

## 2020-02-07 DIAGNOSIS — G43711 Chronic migraine without aura, intractable, with status migrainosus: Secondary | ICD-10-CM | POA: Diagnosis not present

## 2020-05-07 DIAGNOSIS — M5412 Radiculopathy, cervical region: Secondary | ICD-10-CM | POA: Diagnosis not present

## 2020-05-07 DIAGNOSIS — Z79899 Other long term (current) drug therapy: Secondary | ICD-10-CM | POA: Diagnosis not present

## 2020-05-07 DIAGNOSIS — G43719 Chronic migraine without aura, intractable, without status migrainosus: Secondary | ICD-10-CM | POA: Diagnosis not present

## 2020-05-07 DIAGNOSIS — M5417 Radiculopathy, lumbosacral region: Secondary | ICD-10-CM | POA: Diagnosis not present

## 2020-05-07 DIAGNOSIS — G43711 Chronic migraine without aura, intractable, with status migrainosus: Secondary | ICD-10-CM | POA: Diagnosis not present

## 2020-05-07 DIAGNOSIS — R201 Hypoesthesia of skin: Secondary | ICD-10-CM | POA: Diagnosis not present

## 2020-06-13 DIAGNOSIS — G43711 Chronic migraine without aura, intractable, with status migrainosus: Secondary | ICD-10-CM | POA: Diagnosis not present

## 2020-06-13 DIAGNOSIS — G5603 Carpal tunnel syndrome, bilateral upper limbs: Secondary | ICD-10-CM | POA: Diagnosis not present

## 2020-06-13 DIAGNOSIS — F909 Attention-deficit hyperactivity disorder, unspecified type: Secondary | ICD-10-CM | POA: Diagnosis not present

## 2020-06-13 DIAGNOSIS — M5417 Radiculopathy, lumbosacral region: Secondary | ICD-10-CM | POA: Diagnosis not present

## 2020-06-13 DIAGNOSIS — M5412 Radiculopathy, cervical region: Secondary | ICD-10-CM | POA: Diagnosis not present

## 2020-06-13 DIAGNOSIS — Z79899 Other long term (current) drug therapy: Secondary | ICD-10-CM | POA: Diagnosis not present

## 2020-06-13 DIAGNOSIS — R201 Hypoesthesia of skin: Secondary | ICD-10-CM | POA: Diagnosis not present

## 2020-08-22 DIAGNOSIS — G43711 Chronic migraine without aura, intractable, with status migrainosus: Secondary | ICD-10-CM | POA: Diagnosis not present

## 2020-08-22 DIAGNOSIS — M545 Low back pain, unspecified: Secondary | ICD-10-CM | POA: Diagnosis not present

## 2020-08-22 DIAGNOSIS — G43719 Chronic migraine without aura, intractable, without status migrainosus: Secondary | ICD-10-CM | POA: Diagnosis not present

## 2020-08-22 DIAGNOSIS — F5101 Primary insomnia: Secondary | ICD-10-CM | POA: Diagnosis not present

## 2020-08-22 DIAGNOSIS — G5603 Carpal tunnel syndrome, bilateral upper limbs: Secondary | ICD-10-CM | POA: Diagnosis not present

## 2020-08-22 DIAGNOSIS — M542 Cervicalgia: Secondary | ICD-10-CM | POA: Diagnosis not present

## 2020-12-10 DIAGNOSIS — G43711 Chronic migraine without aura, intractable, with status migrainosus: Secondary | ICD-10-CM | POA: Diagnosis not present

## 2020-12-10 DIAGNOSIS — G43719 Chronic migraine without aura, intractable, without status migrainosus: Secondary | ICD-10-CM | POA: Diagnosis not present

## 2020-12-10 DIAGNOSIS — K047 Periapical abscess without sinus: Secondary | ICD-10-CM | POA: Diagnosis not present

## 2020-12-10 DIAGNOSIS — Z79899 Other long term (current) drug therapy: Secondary | ICD-10-CM | POA: Diagnosis not present

## 2020-12-10 DIAGNOSIS — M545 Low back pain, unspecified: Secondary | ICD-10-CM | POA: Diagnosis not present

## 2020-12-10 DIAGNOSIS — M542 Cervicalgia: Secondary | ICD-10-CM | POA: Diagnosis not present

## 2021-01-22 DIAGNOSIS — G43711 Chronic migraine without aura, intractable, with status migrainosus: Secondary | ICD-10-CM | POA: Diagnosis not present

## 2021-01-22 DIAGNOSIS — G5602 Carpal tunnel syndrome, left upper limb: Secondary | ICD-10-CM | POA: Diagnosis not present

## 2021-01-22 DIAGNOSIS — M5412 Radiculopathy, cervical region: Secondary | ICD-10-CM | POA: Diagnosis not present

## 2021-01-22 DIAGNOSIS — M5417 Radiculopathy, lumbosacral region: Secondary | ICD-10-CM | POA: Diagnosis not present

## 2021-01-22 DIAGNOSIS — R41840 Attention and concentration deficit: Secondary | ICD-10-CM | POA: Diagnosis not present

## 2021-01-22 DIAGNOSIS — Z79899 Other long term (current) drug therapy: Secondary | ICD-10-CM | POA: Diagnosis not present

## 2021-04-22 DIAGNOSIS — M5417 Radiculopathy, lumbosacral region: Secondary | ICD-10-CM | POA: Diagnosis not present

## 2021-04-22 DIAGNOSIS — F5101 Primary insomnia: Secondary | ICD-10-CM | POA: Diagnosis not present

## 2021-04-22 DIAGNOSIS — Z79899 Other long term (current) drug therapy: Secondary | ICD-10-CM | POA: Diagnosis not present

## 2021-04-22 DIAGNOSIS — G43719 Chronic migraine without aura, intractable, without status migrainosus: Secondary | ICD-10-CM | POA: Diagnosis not present

## 2021-07-29 DIAGNOSIS — M5417 Radiculopathy, lumbosacral region: Secondary | ICD-10-CM | POA: Diagnosis not present

## 2021-07-29 DIAGNOSIS — M62838 Other muscle spasm: Secondary | ICD-10-CM | POA: Diagnosis not present

## 2021-07-29 DIAGNOSIS — G43711 Chronic migraine without aura, intractable, with status migrainosus: Secondary | ICD-10-CM | POA: Diagnosis not present

## 2021-12-29 ENCOUNTER — Encounter: Payer: Self-pay | Admitting: *Deleted

## 2021-12-29 ENCOUNTER — Emergency Department
Admission: EM | Admit: 2021-12-29 | Discharge: 2021-12-30 | Disposition: A | Discharge status: Home / Self Care | Payer: Medicaid Other | Attending: Emergency Medicine | Admitting: Emergency Medicine

## 2021-12-29 ENCOUNTER — Emergency Department: Payer: Medicaid Other

## 2021-12-29 ENCOUNTER — Other Ambulatory Visit: Payer: Self-pay

## 2021-12-29 DIAGNOSIS — S51811A Laceration without foreign body of right forearm, initial encounter: Secondary | ICD-10-CM | POA: Diagnosis not present

## 2021-12-29 DIAGNOSIS — S59911A Unspecified injury of right forearm, initial encounter: Secondary | ICD-10-CM | POA: Diagnosis present

## 2021-12-29 DIAGNOSIS — J45909 Unspecified asthma, uncomplicated: Secondary | ICD-10-CM | POA: Insufficient documentation

## 2021-12-29 DIAGNOSIS — W25XXXA Contact with sharp glass, initial encounter: Secondary | ICD-10-CM | POA: Diagnosis not present

## 2021-12-29 MED ORDER — LIDOCAINE-EPINEPHRINE (PF) 2 %-1:200000 IJ SOLN
10.0000 mL | Freq: Once | INTRAMUSCULAR | Status: AC
  Administered 2021-12-30: 10 mL
  Filled 2021-12-29: qty 20

## 2021-12-29 MED ORDER — OXYCODONE-ACETAMINOPHEN 5-325 MG PO TABS
2.0000 | ORAL_TABLET | Freq: Once | ORAL | Status: AC
  Administered 2021-12-29: 2 via ORAL
  Filled 2021-12-29: qty 2

## 2021-12-29 MED ORDER — SODIUM BICARBONATE 8.4 % IV SOLN
50.0000 meq | Freq: Once | INTRAVENOUS | Status: AC
  Administered 2021-12-30: 50 meq
  Filled 2021-12-29: qty 50

## 2021-12-29 NOTE — ED Provider Notes (Incomplete)
[facility] Provider Note    Event Date/Time   First MD Initiated Contact with Patient 12/29/21 2331     (approximate)   History   Chief Complaint Laceration   HPI Emily Lynn is a 51 y.o. female, history of ADHD, asthma, arthritis, presents to the emergency department for evaluation of lacerations.  Patient states that she tripped and hit her right forearm on a glass object, which broke off over her arm.  She states that it caused a laceration to the front and back of her forearm.  Bleeding controlled on scene with direct pressure.  Denies any blood thinner use.  Denies any other injuries.  Denies chest pain, shortness of breath, head injury, cold sensation, numbness/tingling, rash/lesions, dizziness/lightheadedness, or nausea/vomiting.  History Limitations: No limitations.        Physical Exam  Triage Vital Signs: ED Triage Vitals  Enc Vitals Group     BP 12/29/21 2129 (!) 141/76     Pulse Rate 12/29/21 2129 81     Resp 12/29/21 2129 20     Temp 12/29/21 2129 98.4 F (36.9 C)     Temp Source 12/29/21 2129 Oral     SpO2 12/29/21 2129 97 %     Weight 12/29/21 2126 130 lb (59 kg)     Height 12/29/21 2126 5\' 5"  (1.651 m)     Head Circumference --      Peak Flow --      Pain Score 12/29/21 2126 10     Pain Loc --      Pain Edu? --      Excl. in [place]? --     Most recent vital signs: Vitals:   12/29/21 2129  BP: (!) 141/76  Pulse: 81  Resp: 20  Temp: 98.4 F (36.9 C)  SpO2: 97%    General: Awake, NAD.  Skin: Warm, dry. No rashes or lesions.  Eyes: PERRL. Conjunctivae normal.  CV: Good peripheral perfusion.  Resp: Normal effort.  Abd: Soft, non-tender. No distention.  Neuro: At baseline. No gross neurological deficits.  Musculoskeletal: Normal ROM of all extremities.  Focused Exam: ***  Physical Exam    ED Results / Procedures / Treatments  Labs (all labs ordered are listed, but only abnormal results are displayed) Labs  Reviewed - No data to display   EKG N/A.    RADIOLOGY  ED Provider Interpretation: ***  No results found.  PROCEDURES:  Critical Care performed: N/A.  Procedures    MEDICATIONS ORDERED IN ED: Medications  lidocaine-EPINEPHrine (XYLOCAINE W/EPI) 2 %-1:200000 (PF) injection 10 mL (has no administration in time range)  sodium bicarbonate injection 50 mEq (has no administration in time range)  oxyCODONE-acetaminophen (PERCOCET/ROXICET) 5-325 MG per tablet 2 tablet (2 tablets Oral Given 12/29/21 2341)     IMPRESSION / MDM / ASSESSMENT AND PLAN / ED COURSE  I reviewed the triage vital signs and the nursing notes.                              Differential diagnosis includes, but is not limited to, forearm laceration, radius/ulna fracture, foreign body.  ED Course ***  Assessment/Plan ***  Considered admission for this patient, but ***  ***Provided the patient with anticipatory guidance, return precautions, and educational material. Encouraged the patient to return to the emergency department at any time if they begin to experience any new or worsening symptoms. Patient expressed understanding and agreed  with the plan.   Patient's presentation is most consistent with {EM COPA:27473}       FINAL CLINICAL IMPRESSION(S) / ED DIAGNOSES   Final diagnoses:  None     Rx / DC Orders   ED Discharge Orders     None        Note:  This document was prepared using Dragon voice recognition software and may include unintentional dictation errors.

## 2021-12-29 NOTE — ED Triage Notes (Signed)
Pt has 2 lacerations to right forearm wrist area.  Bleeding controlled.  Pt cut with glass.  Pt alert

## 2021-12-29 NOTE — ED Provider Notes (Signed)
[facility] Provider Note    Event Date/Time   First MD Initiated Contact with Patient 12/29/21 2331     (approximate)   History   Chief Complaint Laceration   HPI Emily Lynn is a 51 y.o. female, history of ADHD, asthma, arthritis, presents to the emergency department for evaluation of lacerations.  Patient states that she tripped and hit her right forearm on a glass object, which broke off over her arm.  She states that it caused a laceration to the front and back of her forearm.  Bleeding controlled on scene with direct pressure.  Denies any blood thinner use.  Denies any other injuries.  Denies chest pain, shortness of breath, head injury, cold sensation, numbness/tingling, rash/lesions, dizziness/lightheadedness, or nausea/vomiting.  History Limitations: No limitations.        Physical Exam  Triage Vital Signs: ED Triage Vitals  Enc Vitals Group     BP 12/29/21 2129 (!) 141/76     Pulse Rate 12/29/21 2129 81     Resp 12/29/21 2129 20     Temp 12/29/21 2129 98.4 F (36.9 C)     Temp Source 12/29/21 2129 Oral     SpO2 12/29/21 2129 97 %     Weight 12/29/21 2126 130 lb (59 kg)     Height 12/29/21 2126 5\' 5"  (1.651 m)     Head Circumference --      Peak Flow --      Pain Score 12/29/21 2126 10     Pain Loc --      Pain Edu? --      Excl. in [place]? --     Most recent vital signs: Vitals:   12/29/21 2129  BP: (!) 141/76  Pulse: 81  Resp: 20  Temp: 98.4 F (36.9 C)  SpO2: 97%    General: Awake, NAD.  Skin: Warm, dry. No rashes or lesions.  Eyes: PERRL. Conjunctivae normal.  CV: Good peripheral perfusion.  Resp: Normal effort.  Abd: Soft, non-tender. No distention.  Neuro: At baseline. No gross neurological deficits.  Musculoskeletal: Normal ROM of all extremities.  Focused Exam: 4 cm, jagged laceration along the anterior aspect of her distal forearm.  No active bleeding or discharge.  No obvious foreign bodies.  2 cm trial shaped  laceration along the posterior aspect of the forearm, no active bleeding or discharge.  No foreign bodies.  No underlying osseous tenderness.  Patient maintains full range of motion of the hand, digits, and elbow.  PMS intact distally.  Physical Exam    ED Results / Procedures / Treatments  Labs (all labs ordered are listed, but only abnormal results are displayed) Labs Reviewed - No data to display   EKG N/A.    RADIOLOGY  ED Provider Interpretation: I personally viewed and interpreted this x-ray, no evidence of foreign bodies or underlying fractures.  DG Forearm Right  Result Date: 12/30/2021 CLINICAL DATA:  Forearm laceration; concern for foreign body. EXAM: RIGHT FOREARM - 2 VIEW COMPARISON:  None Available. FINDINGS: No acute fracture or dislocation. Laceration about the ulnar aspect of the distal forearm. No radiopaque foreign body. No underlying osseous abnormality. IMPRESSION: Distal forearm laceration.  No radiopaque foreign body. Electronically Signed   By: Placido Sou M.D.   On: 12/30/2021 00:22    PROCEDURES:  Critical Care performed: N/A.  Marland Kitchen.Laceration Repair  Date/Time: 12/30/2021 1:09 AM  Performed by: Teodoro Spray, PA Authorized by: Teodoro Spray, PA   Consent:  Consent obtained:  Verbal   Consent given by:  Patient   Risks, benefits, and alternatives were discussed: yes     Risks discussed:  Infection, pain, retained foreign body, need for additional repair, poor cosmetic result, tendon damage, vascular damage, nerve damage and poor wound healing   Alternatives discussed:  No treatment Universal protocol:    Patient identity confirmed:  Verbally with patient Anesthesia:    Anesthesia method:  Local infiltration   Local anesthetic:  Lidocaine 2% WITH epi Laceration details:    Location:  Shoulder/arm   Shoulder/arm location:  R lower arm   Length (cm):  6   Depth (mm):  2 Pre-procedure details:    Preparation:  Patient was  prepped and draped in usual sterile fashion Exploration:    Wound extent: no foreign body, no signs of injury, no nerve damage, no tendon damage, no underlying fracture and no vascular damage     Contaminated: no   Treatment:    Area cleansed with:  Saline   Amount of cleaning:  Standard   Irrigation solution:  Sterile saline   Irrigation volume:  500 ml   Irrigation method:  Syringe   Debridement:  None   Undermining:  None Skin repair:    Repair method:  Sutures   Suture size:  4-0   Suture material:  Nylon   Suture technique:  Simple interrupted   Number of sutures:  9 Approximation:    Approximation:  Close Repair type:    Repair type:  Simple Post-procedure details:    Dressing:  Sterile dressing and tube gauze   Procedure completion:  Tolerated well, no immediate complications     MEDICATIONS ORDERED IN ED: Medications  oxyCODONE-acetaminophen (PERCOCET/ROXICET) 5-325 MG per tablet 2 tablet (2 tablets Oral Given 12/29/21 2341)  lidocaine-EPINEPHrine (XYLOCAINE W/EPI) 2 %-1:200000 (PF) injection 10 mL (10 mLs Infiltration Given 12/30/21 0107)  sodium bicarbonate injection 50 mEq (50 mEq Other Given 12/30/21 0107)     IMPRESSION / MDM / ASSESSMENT AND PLAN / ED COURSE  I reviewed the triage vital signs and the nursing notes.                              Differential diagnosis includes, but is not limited to, forearm laceration, radius/ulna fracture, foreign body.   Assessment/Plan Patient presents with a 4 cm and 2 cm forearm laceration secondary to mechanical fall onto glass object.  Patient appears well clinically.  No other injuries present.  X-ray shows no evidence of osseous abnormalities or foreign bodies.  Laceration was irrigated and repaired using nonabsorbable, simple interrupted sutures.  See above for details.  Unable to give patient Tdap due to allergy.  Wrapped the arm with sterile gauze.  Advised her to return the emergency department for suture removal  in 7 to 10 days.  Encouraged her to take Tylenol ibuprofen as needed for pain.  Will discharge.  Provided the patient with anticipatory guidance, return precautions, and educational material. Encouraged the patient to return to the emergency department at any time if they begin to experience any new or worsening symptoms. Patient expressed understanding and agreed with the plan.   Patient's presentation is most consistent with acute complicated illness / injury requiring diagnostic workup.       FINAL CLINICAL IMPRESSION(S) / ED DIAGNOSES   Final diagnoses:  Laceration of right forearm, initial encounter     Rx / DC Orders  ED Discharge Orders     None        Note:  This document was prepared using Dragon voice recognition software and may include unintentional dictation errors.   Varney Daily, [place] 12/30/21 0110    Concha Se, MD 12/30/21 [phone]

## 2021-12-29 NOTE — ED Notes (Signed)
Pt not currently in room 48

## 2021-12-30 NOTE — Discharge Instructions (Addendum)
-  The sutures will need to be removed within 7 to 10 days.  You may return here or go to urgent care to have them removed.  -You may take Tylenol/ibuprofen as needed for pain.  -Return to the emergency department anytime if you begin to experience any new or worsening symptoms.

## 2022-01-23 DIAGNOSIS — Z79899 Other long term (current) drug therapy: Secondary | ICD-10-CM | POA: Diagnosis not present

## 2022-05-21 DIAGNOSIS — M5412 Radiculopathy, cervical region: Secondary | ICD-10-CM | POA: Diagnosis not present

## 2022-05-21 DIAGNOSIS — G4700 Insomnia, unspecified: Secondary | ICD-10-CM | POA: Diagnosis not present

## 2022-06-25 DIAGNOSIS — Z760 Encounter for issue of repeat prescription: Secondary | ICD-10-CM | POA: Diagnosis not present

## 2022-06-25 DIAGNOSIS — M5459 Other low back pain: Secondary | ICD-10-CM | POA: Diagnosis not present

## 2022-06-25 DIAGNOSIS — M542 Cervicalgia: Secondary | ICD-10-CM | POA: Diagnosis not present

## 2022-06-25 DIAGNOSIS — G4700 Insomnia, unspecified: Secondary | ICD-10-CM | POA: Diagnosis not present

## 2022-06-25 DIAGNOSIS — G43711 Chronic migraine without aura, intractable, with status migrainosus: Secondary | ICD-10-CM | POA: Diagnosis not present

## 2022-06-25 DIAGNOSIS — Z79899 Other long term (current) drug therapy: Secondary | ICD-10-CM | POA: Diagnosis not present

## 2022-06-25 DIAGNOSIS — R41840 Attention and concentration deficit: Secondary | ICD-10-CM | POA: Diagnosis not present

## 2022-07-23 DIAGNOSIS — Z79899 Other long term (current) drug therapy: Secondary | ICD-10-CM | POA: Diagnosis not present

## 2022-07-23 DIAGNOSIS — M542 Cervicalgia: Secondary | ICD-10-CM | POA: Diagnosis not present

## 2022-07-23 DIAGNOSIS — M5459 Other low back pain: Secondary | ICD-10-CM | POA: Diagnosis not present

## 2022-07-23 DIAGNOSIS — G43711 Chronic migraine without aura, intractable, with status migrainosus: Secondary | ICD-10-CM | POA: Diagnosis not present

## 2022-07-23 DIAGNOSIS — R41840 Attention and concentration deficit: Secondary | ICD-10-CM | POA: Diagnosis not present

## 2022-07-23 DIAGNOSIS — Z760 Encounter for issue of repeat prescription: Secondary | ICD-10-CM | POA: Diagnosis not present

## 2022-07-23 DIAGNOSIS — G4700 Insomnia, unspecified: Secondary | ICD-10-CM | POA: Diagnosis not present

## 2022-08-13 DIAGNOSIS — Z79899 Other long term (current) drug therapy: Secondary | ICD-10-CM | POA: Diagnosis not present

## 2022-08-13 DIAGNOSIS — G894 Chronic pain syndrome: Secondary | ICD-10-CM | POA: Diagnosis not present

## 2022-08-13 DIAGNOSIS — G43711 Chronic migraine without aura, intractable, with status migrainosus: Secondary | ICD-10-CM | POA: Diagnosis not present

## 2022-08-13 DIAGNOSIS — R41840 Attention and concentration deficit: Secondary | ICD-10-CM | POA: Diagnosis not present

## 2022-10-15 DIAGNOSIS — G43711 Chronic migraine without aura, intractable, with status migrainosus: Secondary | ICD-10-CM | POA: Diagnosis not present

## 2022-10-15 DIAGNOSIS — R41840 Attention and concentration deficit: Secondary | ICD-10-CM | POA: Diagnosis not present

## 2022-10-15 DIAGNOSIS — Z79899 Other long term (current) drug therapy: Secondary | ICD-10-CM | POA: Diagnosis not present

## 2022-10-15 DIAGNOSIS — G4700 Insomnia, unspecified: Secondary | ICD-10-CM | POA: Diagnosis not present

## 2022-10-15 DIAGNOSIS — Z760 Encounter for issue of repeat prescription: Secondary | ICD-10-CM | POA: Diagnosis not present

## 2022-11-19 DIAGNOSIS — G5603 Carpal tunnel syndrome, bilateral upper limbs: Secondary | ICD-10-CM | POA: Diagnosis not present

## 2022-12-17 DIAGNOSIS — Z79899 Other long term (current) drug therapy: Secondary | ICD-10-CM | POA: Diagnosis not present

## 2022-12-17 DIAGNOSIS — G43711 Chronic migraine without aura, intractable, with status migrainosus: Secondary | ICD-10-CM | POA: Diagnosis not present

## 2022-12-17 DIAGNOSIS — G4700 Insomnia, unspecified: Secondary | ICD-10-CM | POA: Diagnosis not present

## 2022-12-17 DIAGNOSIS — R41840 Attention and concentration deficit: Secondary | ICD-10-CM | POA: Diagnosis not present

## 2022-12-17 DIAGNOSIS — Z760 Encounter for issue of repeat prescription: Secondary | ICD-10-CM | POA: Diagnosis not present

## 2023-01-18 DIAGNOSIS — R41840 Attention and concentration deficit: Secondary | ICD-10-CM | POA: Diagnosis not present

## 2023-01-18 DIAGNOSIS — G4700 Insomnia, unspecified: Secondary | ICD-10-CM | POA: Diagnosis not present

## 2023-01-18 DIAGNOSIS — G43711 Chronic migraine without aura, intractable, with status migrainosus: Secondary | ICD-10-CM | POA: Diagnosis not present

## 2023-01-18 DIAGNOSIS — Z760 Encounter for issue of repeat prescription: Secondary | ICD-10-CM | POA: Diagnosis not present

## 2023-01-18 DIAGNOSIS — Z79899 Other long term (current) drug therapy: Secondary | ICD-10-CM | POA: Diagnosis not present

## 2023-02-15 DIAGNOSIS — G894 Chronic pain syndrome: Secondary | ICD-10-CM | POA: Diagnosis not present

## 2023-02-15 DIAGNOSIS — Z79899 Other long term (current) drug therapy: Secondary | ICD-10-CM | POA: Diagnosis not present

## 2023-03-15 DIAGNOSIS — Z760 Encounter for issue of repeat prescription: Secondary | ICD-10-CM | POA: Diagnosis not present

## 2023-03-15 DIAGNOSIS — G43711 Chronic migraine without aura, intractable, with status migrainosus: Secondary | ICD-10-CM | POA: Diagnosis not present

## 2023-03-15 DIAGNOSIS — R41840 Attention and concentration deficit: Secondary | ICD-10-CM | POA: Diagnosis not present

## 2023-03-15 DIAGNOSIS — Z79899 Other long term (current) drug therapy: Secondary | ICD-10-CM | POA: Diagnosis not present

## 2023-03-15 DIAGNOSIS — G4700 Insomnia, unspecified: Secondary | ICD-10-CM | POA: Diagnosis not present

## 2023-04-12 DIAGNOSIS — Z79899 Other long term (current) drug therapy: Secondary | ICD-10-CM | POA: Diagnosis not present

## 2023-04-12 DIAGNOSIS — G4700 Insomnia, unspecified: Secondary | ICD-10-CM | POA: Diagnosis not present

## 2023-04-12 DIAGNOSIS — Z760 Encounter for issue of repeat prescription: Secondary | ICD-10-CM | POA: Diagnosis not present

## 2023-04-12 DIAGNOSIS — G43711 Chronic migraine without aura, intractable, with status migrainosus: Secondary | ICD-10-CM | POA: Diagnosis not present

## 2023-04-12 DIAGNOSIS — R41840 Attention and concentration deficit: Secondary | ICD-10-CM | POA: Diagnosis not present

## 2023-05-13 DIAGNOSIS — G4700 Insomnia, unspecified: Secondary | ICD-10-CM | POA: Diagnosis not present

## 2023-05-13 DIAGNOSIS — G43711 Chronic migraine without aura, intractable, with status migrainosus: Secondary | ICD-10-CM | POA: Diagnosis not present

## 2023-05-13 DIAGNOSIS — R41840 Attention and concentration deficit: Secondary | ICD-10-CM | POA: Diagnosis not present

## 2023-05-13 DIAGNOSIS — G894 Chronic pain syndrome: Secondary | ICD-10-CM | POA: Diagnosis not present

## 2023-07-13 DIAGNOSIS — G894 Chronic pain syndrome: Secondary | ICD-10-CM | POA: Diagnosis not present

## 2023-07-13 DIAGNOSIS — R41840 Attention and concentration deficit: Secondary | ICD-10-CM | POA: Diagnosis not present

## 2023-07-13 DIAGNOSIS — Z79899 Other long term (current) drug therapy: Secondary | ICD-10-CM | POA: Diagnosis not present

## 2023-07-13 DIAGNOSIS — G43711 Chronic migraine without aura, intractable, with status migrainosus: Secondary | ICD-10-CM | POA: Diagnosis not present

## 2023-07-13 DIAGNOSIS — Z760 Encounter for issue of repeat prescription: Secondary | ICD-10-CM | POA: Diagnosis not present

## 2023-07-13 DIAGNOSIS — G4700 Insomnia, unspecified: Secondary | ICD-10-CM | POA: Diagnosis not present

## 2023-08-12 DIAGNOSIS — Z79891 Long term (current) use of opiate analgesic: Secondary | ICD-10-CM | POA: Diagnosis not present

## 2023-08-12 DIAGNOSIS — G5603 Carpal tunnel syndrome, bilateral upper limbs: Secondary | ICD-10-CM | POA: Diagnosis not present

## 2023-08-12 DIAGNOSIS — G894 Chronic pain syndrome: Secondary | ICD-10-CM | POA: Diagnosis not present

## 2023-08-12 DIAGNOSIS — M5412 Radiculopathy, cervical region: Secondary | ICD-10-CM | POA: Diagnosis not present

## 2023-08-12 DIAGNOSIS — M5417 Radiculopathy, lumbosacral region: Secondary | ICD-10-CM | POA: Diagnosis not present

## 2023-09-21 DIAGNOSIS — G4700 Insomnia, unspecified: Secondary | ICD-10-CM | POA: Diagnosis not present

## 2023-09-21 DIAGNOSIS — G894 Chronic pain syndrome: Secondary | ICD-10-CM | POA: Diagnosis not present

## 2023-09-21 DIAGNOSIS — R41840 Attention and concentration deficit: Secondary | ICD-10-CM | POA: Diagnosis not present

## 2023-09-21 DIAGNOSIS — G43711 Chronic migraine without aura, intractable, with status migrainosus: Secondary | ICD-10-CM | POA: Diagnosis not present

## 2023-09-21 DIAGNOSIS — M7912 Myalgia of auxiliary muscles, head and neck: Secondary | ICD-10-CM | POA: Diagnosis not present

## 2023-09-21 DIAGNOSIS — Z79899 Other long term (current) drug therapy: Secondary | ICD-10-CM | POA: Diagnosis not present

## 2023-09-21 DIAGNOSIS — G5603 Carpal tunnel syndrome, bilateral upper limbs: Secondary | ICD-10-CM | POA: Diagnosis not present

## 2023-09-21 DIAGNOSIS — M7918 Myalgia, other site: Secondary | ICD-10-CM | POA: Diagnosis not present

## 2023-09-21 DIAGNOSIS — Z79891 Long term (current) use of opiate analgesic: Secondary | ICD-10-CM | POA: Diagnosis not present

## 2023-10-07 DIAGNOSIS — G894 Chronic pain syndrome: Secondary | ICD-10-CM | POA: Diagnosis not present

## 2023-10-07 DIAGNOSIS — Z79899 Other long term (current) drug therapy: Secondary | ICD-10-CM | POA: Diagnosis not present

## 2023-10-07 DIAGNOSIS — M7918 Myalgia, other site: Secondary | ICD-10-CM | POA: Diagnosis not present

## 2023-10-07 DIAGNOSIS — Z79891 Long term (current) use of opiate analgesic: Secondary | ICD-10-CM | POA: Diagnosis not present

## 2023-10-07 DIAGNOSIS — G43711 Chronic migraine without aura, intractable, with status migrainosus: Secondary | ICD-10-CM | POA: Diagnosis not present

## 2023-10-07 DIAGNOSIS — R41840 Attention and concentration deficit: Secondary | ICD-10-CM | POA: Diagnosis not present

## 2023-10-07 DIAGNOSIS — G4700 Insomnia, unspecified: Secondary | ICD-10-CM | POA: Diagnosis not present

## 2023-10-07 DIAGNOSIS — G5603 Carpal tunnel syndrome, bilateral upper limbs: Secondary | ICD-10-CM | POA: Diagnosis not present

## 2023-10-07 DIAGNOSIS — M7912 Myalgia of auxiliary muscles, head and neck: Secondary | ICD-10-CM | POA: Diagnosis not present

## 2023-11-16 DIAGNOSIS — G5603 Carpal tunnel syndrome, bilateral upper limbs: Secondary | ICD-10-CM | POA: Diagnosis not present

## 2023-11-16 DIAGNOSIS — R41840 Attention and concentration deficit: Secondary | ICD-10-CM | POA: Diagnosis not present

## 2023-11-16 DIAGNOSIS — Z79891 Long term (current) use of opiate analgesic: Secondary | ICD-10-CM | POA: Diagnosis not present

## 2023-11-16 DIAGNOSIS — M7918 Myalgia, other site: Secondary | ICD-10-CM | POA: Diagnosis not present

## 2023-11-16 DIAGNOSIS — G43711 Chronic migraine without aura, intractable, with status migrainosus: Secondary | ICD-10-CM | POA: Diagnosis not present

## 2023-11-16 DIAGNOSIS — M7912 Myalgia of auxiliary muscles, head and neck: Secondary | ICD-10-CM | POA: Diagnosis not present

## 2023-11-16 DIAGNOSIS — G894 Chronic pain syndrome: Secondary | ICD-10-CM | POA: Diagnosis not present

## 2023-11-16 DIAGNOSIS — G4700 Insomnia, unspecified: Secondary | ICD-10-CM | POA: Diagnosis not present

## 2023-12-14 DIAGNOSIS — M7912 Myalgia of auxiliary muscles, head and neck: Secondary | ICD-10-CM | POA: Diagnosis not present

## 2023-12-14 DIAGNOSIS — R41840 Attention and concentration deficit: Secondary | ICD-10-CM | POA: Diagnosis not present

## 2023-12-14 DIAGNOSIS — Z79891 Long term (current) use of opiate analgesic: Secondary | ICD-10-CM | POA: Diagnosis not present

## 2023-12-14 DIAGNOSIS — G5603 Carpal tunnel syndrome, bilateral upper limbs: Secondary | ICD-10-CM | POA: Diagnosis not present

## 2023-12-14 DIAGNOSIS — G894 Chronic pain syndrome: Secondary | ICD-10-CM | POA: Diagnosis not present

## 2023-12-14 DIAGNOSIS — G4700 Insomnia, unspecified: Secondary | ICD-10-CM | POA: Diagnosis not present

## 2023-12-14 DIAGNOSIS — M7918 Myalgia, other site: Secondary | ICD-10-CM | POA: Diagnosis not present

## 2023-12-14 DIAGNOSIS — G43711 Chronic migraine without aura, intractable, with status migrainosus: Secondary | ICD-10-CM | POA: Diagnosis not present

## 2024-01-11 DIAGNOSIS — M7912 Myalgia of auxiliary muscles, head and neck: Secondary | ICD-10-CM | POA: Diagnosis not present

## 2024-01-11 DIAGNOSIS — G894 Chronic pain syndrome: Secondary | ICD-10-CM | POA: Diagnosis not present

## 2024-01-11 DIAGNOSIS — G4700 Insomnia, unspecified: Secondary | ICD-10-CM | POA: Diagnosis not present

## 2024-01-11 DIAGNOSIS — G43711 Chronic migraine without aura, intractable, with status migrainosus: Secondary | ICD-10-CM | POA: Diagnosis not present

## 2024-01-11 DIAGNOSIS — Z79891 Long term (current) use of opiate analgesic: Secondary | ICD-10-CM | POA: Diagnosis not present

## 2024-01-11 DIAGNOSIS — R41840 Attention and concentration deficit: Secondary | ICD-10-CM | POA: Diagnosis not present

## 2024-01-11 DIAGNOSIS — G5603 Carpal tunnel syndrome, bilateral upper limbs: Secondary | ICD-10-CM | POA: Diagnosis not present
